# Patient Record
Sex: Female | Born: 1965 | Race: White | Hispanic: No | Marital: Married | Smoking: Former smoker
Health system: Southern US, Community
[De-identification: ages and names within clinical notes are randomized; demographics above are authoritative.]

## PROBLEM LIST (undated history)

## (undated) DIAGNOSIS — I272 Pulmonary hypertension, unspecified: Secondary | ICD-10-CM

## (undated) DIAGNOSIS — C801 Malignant (primary) neoplasm, unspecified: Secondary | ICD-10-CM

## (undated) DIAGNOSIS — J449 Chronic obstructive pulmonary disease, unspecified: Secondary | ICD-10-CM

## (undated) HISTORY — PX: CHOLECYSTECTOMY: SHX55

## (undated) HISTORY — PX: OTHER SURGICAL HISTORY: SHX169

---

## 2005-11-20 ENCOUNTER — Emergency Department: Payer: Self-pay | Admitting: Emergency Medicine

## 2006-03-25 ENCOUNTER — Emergency Department: Payer: Self-pay | Admitting: Unknown Physician Specialty

## 2008-10-12 ENCOUNTER — Emergency Department: Payer: Self-pay

## 2009-08-12 ENCOUNTER — Emergency Department: Payer: Self-pay | Admitting: Emergency Medicine

## 2010-08-26 ENCOUNTER — Ambulatory Visit: Payer: Self-pay | Admitting: Family Medicine

## 2011-04-24 ENCOUNTER — Emergency Department: Payer: Self-pay | Admitting: Emergency Medicine

## 2011-08-11 ENCOUNTER — Ambulatory Visit: Payer: Self-pay | Admitting: Family Medicine

## 2011-12-25 ENCOUNTER — Emergency Department: Payer: Self-pay | Admitting: Emergency Medicine

## 2012-03-21 ENCOUNTER — Observation Stay: Payer: Self-pay | Admitting: Internal Medicine

## 2012-03-21 LAB — BASIC METABOLIC PANEL
Anion Gap: 8 (ref 7–16)
BUN: 8 mg/dL (ref 7–18)
Calcium, Total: 8.8 mg/dL (ref 8.5–10.1)
Chloride: 109 mmol/L — ABNORMAL HIGH (ref 98–107)
EGFR (Non-African Amer.): >60
Glucose: 91 mg/dL (ref 65–99)
Osmolality: 281 (ref 275–301)
Sodium: 142 mmol/L (ref 136–145)

## 2012-03-21 LAB — CBC
Platelet: 210 10*3/uL (ref 150–440)
RBC: 4.22 10*6/uL (ref 3.80–5.20)
RDW: 12.9 % (ref 11.5–14.5)

## 2012-03-21 LAB — PREGNANCY, URINE: Pregnancy Test, Urine: NEGATIVE m[IU]/mL

## 2012-03-21 LAB — CK-MB: CK-MB: 0.6 ng/mL (ref 0.5–3.6)

## 2012-03-21 LAB — CK TOTAL AND CKMB (NOT AT ARMC)
CK, Total: 92 U/L (ref 21–215)
CK-MB: 0.6 ng/mL (ref 0.5–3.6)

## 2012-03-21 LAB — TROPONIN I: Troponin-I: <0.02 ng/mL

## 2012-03-21 LAB — CK: CK, Total: 72 U/L (ref 21–215)

## 2012-03-22 ENCOUNTER — Ambulatory Visit: Payer: Self-pay | Admitting: Oncology

## 2012-03-22 LAB — CBC WITH DIFFERENTIAL/PLATELET
Basophil #: 0.1 10*3/uL (ref 0.0–0.1)
Basophil %: 1.3 %
Eosinophil %: 2.5 %
HGB: 11.7 g/dL — ABNORMAL LOW (ref 12.0–16.0)
Lymphocyte #: 2.8 10*3/uL (ref 1.0–3.6)
MCH: 29.5 pg (ref 26.0–34.0)
MCHC: 32.4 g/dL (ref 32.0–36.0)
MCV: 91 fL (ref 80–100)
Monocyte #: 0.6 x10 3/mm (ref 0.2–0.9)
Neutrophil #: 2.9 10*3/uL (ref 1.4–6.5)
Neutrophil %: 44.3 %
RBC: 3.97 10*6/uL (ref 3.80–5.20)
RDW: 13.2 % (ref 11.5–14.5)
WBC: 6.5 10*3/uL (ref 3.6–11.0)

## 2012-03-22 LAB — BASIC METABOLIC PANEL
BUN: 9 mg/dL (ref 7–18)
Calcium, Total: 8.1 mg/dL — ABNORMAL LOW (ref 8.5–10.1)
Chloride: 111 mmol/L — ABNORMAL HIGH (ref 98–107)
Co2: 28 mmol/L (ref 21–32)
Creatinine: 0.99 mg/dL (ref 0.60–1.30)
EGFR (African American): >60
EGFR (Non-African Amer.): >60
Glucose: 86 mg/dL (ref 65–99)

## 2012-03-22 LAB — LIPID PANEL
Cholesterol: 158 mg/dL (ref 0–200)
HDL Cholesterol: 31 mg/dL — ABNORMAL LOW (ref 40–60)
Triglycerides: 127 mg/dL (ref 0–200)
VLDL Cholesterol, Calc: 25 mg/dL (ref 5–40)

## 2012-03-22 LAB — TROPONIN I: Troponin-I: <0.02 ng/mL

## 2012-03-28 ENCOUNTER — Ambulatory Visit: Payer: Self-pay | Admitting: Oncology

## 2012-03-29 ENCOUNTER — Ambulatory Visit: Payer: Self-pay | Admitting: Oncology

## 2012-04-08 ENCOUNTER — Ambulatory Visit: Payer: Self-pay | Admitting: Oncology

## 2012-04-13 ENCOUNTER — Emergency Department: Payer: Self-pay

## 2012-04-13 LAB — CBC WITH DIFFERENTIAL/PLATELET
Basophil %: 2.2 %
Eosinophil %: 0 %
HCT: 35.1 % (ref 35.0–47.0)
HGB: 11.8 g/dL — ABNORMAL LOW (ref 12.0–16.0)
Lymphocyte #: 1 10*3/uL (ref 1.0–3.6)
Lymphocyte %: 7.2 %
Neutrophil %: 83.8 %
Platelet: 180 10*3/uL (ref 150–440)
RDW: 13.3 % (ref 11.5–14.5)
WBC: 13.7 10*3/uL — ABNORMAL HIGH (ref 3.6–11.0)

## 2012-04-13 LAB — COMPREHENSIVE METABOLIC PANEL
Albumin: 3.4 g/dL (ref 3.4–5.0)
Anion Gap: 10 (ref 7–16)
Bilirubin,Total: 0.7 mg/dL (ref 0.2–1.0)
Calcium, Total: 8.2 mg/dL — ABNORMAL LOW (ref 8.5–10.1)
Co2: 23 mmol/L (ref 21–32)
EGFR (African American): >60
Glucose: 122 mg/dL — ABNORMAL HIGH (ref 65–99)
Osmolality: 280 (ref 275–301)
SGPT (ALT): 40 U/L
Sodium: 140 mmol/L (ref 136–145)

## 2012-04-27 ENCOUNTER — Ambulatory Visit: Payer: Self-pay | Admitting: Internal Medicine

## 2012-05-08 ENCOUNTER — Ambulatory Visit: Payer: Self-pay | Admitting: Oncology

## 2012-06-08 ENCOUNTER — Ambulatory Visit: Payer: Self-pay | Admitting: Oncology

## 2012-06-12 ENCOUNTER — Other Ambulatory Visit (HOSPITAL_COMMUNITY): Payer: Self-pay | Admitting: Cardiothoracic Surgery

## 2012-06-12 DIAGNOSIS — R911 Solitary pulmonary nodule: Secondary | ICD-10-CM

## 2012-06-15 ENCOUNTER — Encounter (HOSPITAL_COMMUNITY): Payer: Self-pay | Admitting: Pharmacy Technician

## 2012-06-15 ENCOUNTER — Other Ambulatory Visit: Payer: Self-pay | Admitting: Radiology

## 2012-06-19 ENCOUNTER — Ambulatory Visit (HOSPITAL_COMMUNITY)
Admission: RE | Admit: 2012-06-19 | Discharge: 2012-06-19 | Disposition: A | Discharge status: Home / Self Care | Payer: Medicaid Other | Source: Ambulatory Visit | Attending: Cardiothoracic Surgery | Admitting: Cardiothoracic Surgery

## 2012-06-19 ENCOUNTER — Encounter (HOSPITAL_COMMUNITY): Payer: Self-pay

## 2012-06-19 ENCOUNTER — Ambulatory Visit (HOSPITAL_COMMUNITY)
Admission: RE | Admit: 2012-06-19 | Discharge: 2012-06-19 | Disposition: A | Discharge status: Home / Self Care | Payer: Medicaid Other | Source: Ambulatory Visit | Attending: Interventional Radiology | Admitting: Interventional Radiology

## 2012-06-19 DIAGNOSIS — J449 Chronic obstructive pulmonary disease, unspecified: Secondary | ICD-10-CM | POA: Insufficient documentation

## 2012-06-19 DIAGNOSIS — Z87891 Personal history of nicotine dependence: Secondary | ICD-10-CM | POA: Insufficient documentation

## 2012-06-19 DIAGNOSIS — Y849 Medical procedure, unspecified as the cause of abnormal reaction of the patient, or of later complication, without mention of misadventure at the time of the procedure: Secondary | ICD-10-CM | POA: Insufficient documentation

## 2012-06-19 DIAGNOSIS — R911 Solitary pulmonary nodule: Secondary | ICD-10-CM

## 2012-06-19 DIAGNOSIS — Z09 Encounter for follow-up examination after completed treatment for conditions other than malignant neoplasm: Secondary | ICD-10-CM | POA: Insufficient documentation

## 2012-06-19 DIAGNOSIS — D3A090 Benign carcinoid tumor of the bronchus and lung: Secondary | ICD-10-CM | POA: Insufficient documentation

## 2012-06-19 DIAGNOSIS — IMO0002 Reserved for concepts with insufficient information to code with codable children: Secondary | ICD-10-CM | POA: Insufficient documentation

## 2012-06-19 DIAGNOSIS — J4489 Other specified chronic obstructive pulmonary disease: Secondary | ICD-10-CM | POA: Insufficient documentation

## 2012-06-19 DIAGNOSIS — I2789 Other specified pulmonary heart diseases: Secondary | ICD-10-CM | POA: Insufficient documentation

## 2012-06-19 HISTORY — DX: Pulmonary hypertension, unspecified: I27.20

## 2012-06-19 HISTORY — DX: Chronic obstructive pulmonary disease, unspecified: J44.9

## 2012-06-19 LAB — CBC
MCH: 29.3 pg (ref 26.0–34.0)
Platelets: 212 10*3/uL (ref 150–400)
RBC: 4.23 MIL/uL (ref 3.87–5.11)
RDW: 13.1 % (ref 11.5–15.5)
WBC: 6.2 10*3/uL (ref 4.0–10.5)

## 2012-06-19 LAB — PROTIME-INR: Prothrombin Time: 13.1 seconds (ref 11.6–15.2)

## 2012-06-19 MED ORDER — SODIUM CHLORIDE 0.9 % IV SOLN
INTRAVENOUS | Status: DC
  Administered 2012-06-19: 09:00:00 via INTRAVENOUS

## 2012-06-19 MED ORDER — DIPHENHYDRAMINE HCL 50 MG/ML IJ SOLN
25.0000 mg | Freq: Once | INTRAMUSCULAR | Status: AC
  Administered 2012-06-19: 25 mg via INTRAVENOUS
  Filled 2012-06-19: qty 0.5

## 2012-06-19 MED ORDER — HYDROMORPHONE HCL PF 2 MG/ML IJ SOLN: 1.0000 mg | INTRAMUSCULAR | Status: DC | PRN

## 2012-06-19 MED ORDER — DIPHENHYDRAMINE HCL 50 MG/ML IJ SOLN
INTRAMUSCULAR | Status: AC
  Administered 2012-06-19: 25 mg via INTRAVENOUS
  Filled 2012-06-19: qty 1

## 2012-06-19 MED ORDER — HYDROMORPHONE HCL PF 1 MG/ML IJ SOLN
INTRAMUSCULAR | Status: AC
  Administered 2012-06-19: 1 mg via INTRAVENOUS
  Filled 2012-06-19: qty 1

## 2012-06-19 MED ORDER — MIDAZOLAM HCL 5 MG/5ML IJ SOLN
INTRAMUSCULAR | Status: AC | PRN
  Administered 2012-06-19 (×2): 2 mg via INTRAVENOUS

## 2012-06-19 MED ORDER — DIPHENHYDRAMINE HCL 25 MG PO CAPS: 25.0000 mg | ORAL_CAPSULE | ORAL | Status: DC

## 2012-06-19 MED ORDER — DIPHENHYDRAMINE HCL 25 MG PO TABS
25.0000 mg | ORAL_TABLET | ORAL | Status: DC
  Administered 2012-06-19: 25 mg via ORAL
  Filled 2012-06-19: qty 1

## 2012-06-19 MED ORDER — DIPHENHYDRAMINE HCL 25 MG PO CAPS
25.0000 mg | ORAL_CAPSULE | ORAL | Status: DC
  Filled 2012-06-19 (×2): qty 1

## 2012-06-19 MED ORDER — FENTANYL CITRATE 0.05 MG/ML IJ SOLN
INTRAMUSCULAR | Status: AC | PRN
  Administered 2012-06-19: 100 ug via INTRAVENOUS

## 2012-06-19 MED ORDER — HYDROCODONE-ACETAMINOPHEN 5-325 MG PO TABS
1.0000 | ORAL_TABLET | ORAL | Status: DC | PRN
  Administered 2012-06-19: 1 via ORAL
  Filled 2012-06-19: qty 2
  Filled 2012-06-19: qty 1

## 2012-06-19 NOTE — Progress Notes (Signed)
Ambulated to BR with minimal assist. Tolerated this well. Pain is O. Still c/o of mild itching. Placed call to Dr Fredia Sorrow and orders received and pt is OK for discharge

## 2012-06-19 NOTE — H&P (Signed)
Agree 

## 2012-06-19 NOTE — Progress Notes (Addendum)
Pt returns form Radiology post lung bx warm and dry color pink . States pain right anterior ches is 7/10 then falls asleep. VSS Band-Aid right post thorasic  area has small amt blood on it. Pt has small amount of blood from nares on tissue

## 2012-06-19 NOTE — Procedures (Signed)
Procedure:  CT guided core biopsy of RLL lung nodule Findings:  9 mm RLL nodule.  CT guided 18 G core bx x 2 via 17 G needle.  Regional hemorrhage w/ hemoptysis.  No PTX.

## 2012-06-19 NOTE — Progress Notes (Signed)
Pt awaken from sound sleep states she is itching all over .no visible rash. Pt states pain is 8/10 then back to sleep Placed call to Dr Fredia Sorrow orders recieved

## 2012-06-19 NOTE — H&P (Signed)
Heather Zamora is an 46 y.o. female.   Chief Complaint: right lung mass HPI: Patient with history of COPD/ prior long term tobacco use and recent imaging studies revealing a right lower lobe lung nodule. She presents today for CT guided biopsy of the right lung nodule.  Past Medical History  Diagnosis Date  . COPD (chronic obstructive pulmonary disease)   . Pulmonary hypertension     doctor gave okay for procedure today    Past Surgical History  Procedure Date  . Cholecystectomy   . Cystectomy ovary   . Ovarian cyst removed    FH: 6 brothers, 1 deceased; positive for CAD Social History: married, lives in [place], 4 children, prior smoker, no alcohol use Allergies:  Allergies  Allergen Reactions  . Penicillins Hives    Current outpatient prescriptions:ibuprofen (ADVIL,MOTRIN) 200 MG tablet, Take 1,000 mg by mouth every 6 (six) hours as needed. pain, Disp: , Rfl:  Current facility-administered medications:0.9 %  sodium chloride infusion, , Intravenous, Continuous, Brayton El, PA   Results for orders placed during the hospital encounter of 06/19/12 (from the past 48 hour(s))  CBC     Status: Normal   Collection Time   06/19/12  8:49 AM      Component Value Range Comment   WBC 6.2  4.0 - 10.5 K/uL    RBC 4.23  3.87 - 5.11 MIL/uL    Hemoglobin 12.4  12.0 - 15.0 g/dL    HCT 29.5  28.4 - 13.2 %    MCV 87.9  78.0 - 100.0 fL    MCH 29.3  26.0 - 34.0 pg    MCHC 33.3  30.0 - 36.0 g/dL    RDW 44.0  10.2 - 72.5 %    Platelets 212  150 - 400 K/uL    Results for orders placed during the hospital encounter of 06/19/12  APTT      Component Value Range   aPTT 32  24 - 37 seconds  CBC      Component Value Range   WBC 6.2  4.0 - 10.5 K/uL   RBC 4.23  3.87 - 5.11 MIL/uL   Hemoglobin 12.4  12.0 - 15.0 g/dL   HCT 36.6  44.0 - 34.7 %   MCV 87.9  78.0 - 100.0 fL   MCH 29.3  26.0 - 34.0 pg   MCHC 33.3  30.0 - 36.0 g/dL   RDW 42.5  95.6 - 38.7 %   Platelets 212  150 - 400 K/uL    PROTIME-INR      Component Value Range   Prothrombin Time 13.1  11.6 - 15.2 seconds   INR 0.97  0.00 - 1.49    Review of Systems  Constitutional: Negative for fever and chills.  Respiratory: Positive for shortness of breath. Negative for cough.   Cardiovascular: Positive for chest pain.  Gastrointestinal: Negative for nausea, vomiting and abdominal pain.  Musculoskeletal: Positive for back pain.  Neurological: Negative for headaches.  Endo/Heme/Allergies: Does not bruise/bleed easily.    Blood pressure 116/79, pulse 59, temperature 97.1 F (36.2 C), temperature source Oral, resp. rate 16, height 5\' 1"  (1.549 m), weight 165 lb (74.844 kg), SpO2 100.00%. Physical Exam  Constitutional: She is oriented to person, place, and time. She appears well-developed and well-nourished.  Cardiovascular: Regular rhythm.        bradycardic  Respiratory: Effort normal.       Distant BS bilat  GI: Soft. Bowel sounds are normal. There is no tenderness.  Musculoskeletal: Normal range of motion. She exhibits no edema.  Neurological: She is alert and oriented to person, place, and time.     Assessment/Plan Patient with COPD/ prior history of smoking and RLL lung nodule; plan is for CT guided biopsy of the right lung nodule. Details/risks of the procedure d/w pt/husband with their understanding and consent.  Ceazia Harb,D KEVIN 06/19/2012, 9:10 AM

## 2012-06-29 ENCOUNTER — Ambulatory Visit: Payer: Self-pay | Admitting: Cardiothoracic Surgery

## 2012-06-29 LAB — COMPREHENSIVE METABOLIC PANEL
Albumin: 4 g/dL (ref 3.4–5.0)
Anion Gap: 7 (ref 7–16)
BUN: 10 mg/dL (ref 7–18)
Bilirubin,Total: 0.7 mg/dL (ref 0.2–1.0)
Chloride: 102 mmol/L (ref 98–107)
Creatinine: 1.08 mg/dL (ref 0.60–1.30)
EGFR (African American): >60
EGFR (Non-African Amer.): >60
Glucose: 92 mg/dL (ref 65–99)
Potassium: 4.2 mmol/L (ref 3.5–5.1)
SGOT(AST): 18 U/L (ref 15–37)
Sodium: 139 mmol/L (ref 136–145)
Total Protein: 7.9 g/dL (ref 6.4–8.2)

## 2012-06-29 LAB — CBC CANCER CENTER
Basophil #: 0.1 x10 3/mm (ref 0.0–0.1)
HCT: 38.7 % (ref 35.0–47.0)
Lymphocyte #: 2.6 x10 3/mm (ref 1.0–3.6)
MCH: 29.1 pg (ref 26.0–34.0)
MCV: 89 fL (ref 80–100)
Monocyte #: 0.5 x10 3/mm (ref 0.2–0.9)
Neutrophil #: 3.8 x10 3/mm (ref 1.4–6.5)
Platelet: 249 x10 3/mm (ref 150–440)
RBC: 4.37 10*6/uL (ref 3.80–5.20)
RDW: 13.5 % (ref 11.5–14.5)
WBC: 7.1 x10 3/mm (ref 3.6–11.0)

## 2012-06-29 LAB — APTT: Activated PTT: 30.3 secs (ref 23.6–35.9)

## 2012-07-09 ENCOUNTER — Ambulatory Visit: Payer: Self-pay | Admitting: Oncology

## 2012-07-11 ENCOUNTER — Ambulatory Visit: Payer: Self-pay

## 2012-07-27 LAB — CBC CANCER CENTER
Basophil #: 0.1 x10 3/mm (ref 0.0–0.1)
Eosinophil #: 0.1 x10 3/mm (ref 0.0–0.7)
Lymphocyte #: 2.5 x10 3/mm (ref 1.0–3.6)
Lymphocyte %: 36.8 %
MCHC: 32.8 g/dL (ref 32.0–36.0)
Monocyte %: 7.7 %
Platelet: 229 x10 3/mm (ref 150–440)
RBC: 4.11 10*6/uL (ref 3.80–5.20)
RDW: 13 % (ref 11.5–14.5)

## 2012-07-27 LAB — COMPREHENSIVE METABOLIC PANEL
Albumin: 3.9 g/dL (ref 3.4–5.0)
Anion Gap: 5 — ABNORMAL LOW (ref 7–16)
BUN: 8 mg/dL (ref 7–18)
Bilirubin,Total: 0.5 mg/dL (ref 0.2–1.0)
Chloride: 103 mmol/L (ref 98–107)
Creatinine: 1.15 mg/dL (ref 0.60–1.30)
EGFR (African American): >60
Potassium: 4.3 mmol/L (ref 3.5–5.1)
Total Protein: 7.7 g/dL (ref 6.4–8.2)

## 2012-08-02 ENCOUNTER — Inpatient Hospital Stay: Payer: Self-pay

## 2012-08-03 LAB — CBC WITH DIFFERENTIAL/PLATELET
Basophil #: 0.1 10*3/uL (ref 0.0–0.1)
Basophil %: 0.9 %
Eosinophil #: 0 10*3/uL (ref 0.0–0.7)
HCT: 32.5 % — ABNORMAL LOW (ref 35.0–47.0)
HGB: 11.4 g/dL — ABNORMAL LOW (ref 12.0–16.0)
Lymphocyte %: 19.5 %
MCHC: 34.9 g/dL (ref 32.0–36.0)
Monocyte #: 0.8 x10 3/mm (ref 0.2–0.9)
Monocyte %: 9.6 %
Neutrophil %: 69.8 %
RDW: 13 % (ref 11.5–14.5)
WBC: 8.7 10*3/uL (ref 3.6–11.0)

## 2012-08-03 LAB — BASIC METABOLIC PANEL
Co2: 27 mmol/L (ref 21–32)
Creatinine: 0.91 mg/dL (ref 0.60–1.30)
EGFR (Non-African Amer.): >60
Osmolality: 274 (ref 275–301)
Sodium: 137 mmol/L (ref 136–145)

## 2012-08-05 LAB — BASIC METABOLIC PANEL
Anion Gap: 7 (ref 7–16)
BUN: 6 mg/dL — ABNORMAL LOW (ref 7–18)
Calcium, Total: 8.7 mg/dL (ref 8.5–10.1)
Creatinine: 0.69 mg/dL (ref 0.60–1.30)
EGFR (African American): >60
EGFR (Non-African Amer.): >60
Glucose: 109 mg/dL — ABNORMAL HIGH (ref 65–99)
Osmolality: 281 (ref 275–301)

## 2012-08-05 LAB — CBC WITH DIFFERENTIAL/PLATELET
Basophil #: 0.1 10*3/uL (ref 0.0–0.1)
Eosinophil %: 1.4 %
HCT: 33 % — ABNORMAL LOW (ref 35.0–47.0)
HGB: 11 g/dL — ABNORMAL LOW (ref 12.0–16.0)
Lymphocyte #: 1.6 10*3/uL (ref 1.0–3.6)
MCH: 29.8 pg (ref 26.0–34.0)
MCHC: 33.4 g/dL (ref 32.0–36.0)
MCV: 89 fL (ref 80–100)
Monocyte #: 0.5 x10 3/mm (ref 0.2–0.9)
RBC: 3.7 10*6/uL — ABNORMAL LOW (ref 3.80–5.20)

## 2012-08-08 ENCOUNTER — Ambulatory Visit: Payer: Self-pay | Admitting: Oncology

## 2012-08-10 ENCOUNTER — Ambulatory Visit: Payer: Self-pay | Admitting: Cardiothoracic Surgery

## 2012-08-10 LAB — CBC CANCER CENTER
Basophil #: 0.1 x10 3/mm (ref 0.0–0.1)
Eosinophil #: 0.1 x10 3/mm (ref 0.0–0.7)
Eosinophil %: 1.6 %
Lymphocyte %: 18.5 %
MCH: 29.7 pg (ref 26.0–34.0)
MCHC: 33 g/dL (ref 32.0–36.0)
MCV: 90 fL (ref 80–100)
Monocyte #: 0.6 x10 3/mm (ref 0.2–0.9)
Monocyte %: 7.3 %
Neutrophil #: 6.3 x10 3/mm (ref 1.4–6.5)
Platelet: 309 x10 3/mm (ref 150–440)
RBC: 3.85 10*6/uL (ref 3.80–5.20)
RDW: 12.9 % (ref 11.5–14.5)
WBC: 8.8 x10 3/mm (ref 3.6–11.0)

## 2012-08-14 ENCOUNTER — Ambulatory Visit: Payer: Self-pay | Admitting: Cardiothoracic Surgery

## 2012-08-29 ENCOUNTER — Emergency Department: Payer: Self-pay | Admitting: Emergency Medicine

## 2012-08-29 LAB — CBC
HCT: 34.3 % — ABNORMAL LOW
HGB: 11.7 g/dL — ABNORMAL LOW
MCH: 29.9 pg
MCHC: 34.2 g/dL
MCV: 88 fL
Platelet: 230 10*3/uL
RBC: 3.92 X10 6/mm 3
RDW: 12.9 %
WBC: 7 10*3/uL

## 2012-08-29 LAB — PROTIME-INR
INR: 1
Prothrombin Time: 13.4 s

## 2012-08-29 LAB — COMPREHENSIVE METABOLIC PANEL
Alkaline Phosphatase: 138 U/L — ABNORMAL HIGH (ref 50–136)
Anion Gap: 8 (ref 7–16)
BUN: 7 mg/dL (ref 7–18)
Bilirubin,Total: 0.3 mg/dL (ref 0.2–1.0)
Calcium, Total: 8.7 mg/dL (ref 8.5–10.1)
Chloride: 106 mmol/L (ref 98–107)
EGFR (African American): >60
EGFR (Non-African Amer.): >60
Potassium: 3.7 mmol/L (ref 3.5–5.1)
SGOT(AST): 31 U/L (ref 15–37)
SGPT (ALT): 40 U/L (ref 12–78)
Sodium: 141 mmol/L (ref 136–145)

## 2012-08-29 LAB — CK TOTAL AND CKMB (NOT AT ARMC)
CK, Total: 79 U/L
CK-MB: 0.6 ng/mL

## 2012-08-29 LAB — APTT: Activated PTT: 30.9 s

## 2012-08-29 LAB — TROPONIN I: Troponin-I: <0.02 ng/mL

## 2012-09-08 ENCOUNTER — Ambulatory Visit: Payer: Self-pay | Admitting: Oncology

## 2012-10-08 ENCOUNTER — Ambulatory Visit: Payer: Self-pay | Admitting: Oncology

## 2012-11-08 ENCOUNTER — Ambulatory Visit: Payer: Self-pay | Admitting: Oncology

## 2012-12-05 ENCOUNTER — Ambulatory Visit: Payer: Self-pay | Admitting: Nurse Practitioner

## 2012-12-12 ENCOUNTER — Ambulatory Visit: Payer: Self-pay | Admitting: Oncology

## 2013-01-06 ENCOUNTER — Ambulatory Visit: Payer: Self-pay | Admitting: Oncology

## 2013-01-16 LAB — CBC CANCER CENTER
Basophil #: 0.1 x10 3/mm (ref 0.0–0.1)
Eosinophil #: 0.1 x10 3/mm (ref 0.0–0.7)
HCT: 37.5 % (ref 35.0–47.0)
HGB: 12.5 g/dL (ref 12.0–16.0)
Lymphocyte %: 29.9 %
MCH: 28.8 pg (ref 26.0–34.0)
MCHC: 33.4 g/dL (ref 32.0–36.0)
MCV: 86 fL (ref 80–100)
Monocyte #: 0.6 x10 3/mm (ref 0.2–0.9)
Neutrophil #: 4 x10 3/mm (ref 1.4–6.5)
Neutrophil %: 58.5 %
RDW: 13.6 % (ref 11.5–14.5)
WBC: 6.8 x10 3/mm (ref 3.6–11.0)

## 2013-01-16 LAB — COMPREHENSIVE METABOLIC PANEL
Alkaline Phosphatase: 114 U/L (ref 50–136)
Anion Gap: 6 — ABNORMAL LOW (ref 7–16)
Bilirubin,Total: 0.6 mg/dL (ref 0.2–1.0)
Calcium, Total: 9.4 mg/dL (ref 8.5–10.1)
Co2: 30 mmol/L (ref 21–32)
Creatinine: 1.07 mg/dL (ref 0.60–1.30)
EGFR (African American): >60
EGFR (Non-African Amer.): >60
Glucose: 78 mg/dL (ref 65–99)
Osmolality: 279 (ref 275–301)
SGOT(AST): 23 U/L (ref 15–37)
SGPT (ALT): 38 U/L (ref 12–78)
Sodium: 140 mmol/L (ref 136–145)

## 2013-02-06 ENCOUNTER — Ambulatory Visit: Payer: Self-pay | Admitting: Oncology

## 2013-03-21 ENCOUNTER — Emergency Department: Payer: Self-pay | Admitting: Emergency Medicine

## 2013-03-21 LAB — COMPREHENSIVE METABOLIC PANEL
Alkaline Phosphatase: 94 U/L (ref 50–136)
BUN: 10 mg/dL (ref 7–18)
Chloride: 108 mmol/L — ABNORMAL HIGH (ref 98–107)
Creatinine: 0.99 mg/dL (ref 0.60–1.30)
EGFR (African American): >60
EGFR (Non-African Amer.): >60
Osmolality: 276 (ref 275–301)
Potassium: 4.1 mmol/L (ref 3.5–5.1)
SGPT (ALT): 16 U/L (ref 12–78)
Total Protein: 7.1 g/dL (ref 6.4–8.2)

## 2013-03-21 LAB — CK TOTAL AND CKMB (NOT AT ARMC): CK, Total: 175 U/L (ref 21–215)

## 2013-03-21 LAB — TROPONIN I: Troponin-I: <0.02 ng/mL

## 2013-03-21 LAB — CBC
MCHC: 34.5 g/dL (ref 32.0–36.0)
Platelet: 220 10*3/uL (ref 150–440)
RDW: 13.3 % (ref 11.5–14.5)
WBC: 5.6 10*3/uL (ref 3.6–11.0)

## 2013-03-21 LAB — PRO B NATRIURETIC PEPTIDE: B-Type Natriuretic Peptide: 29 pg/mL (ref 0–125)

## 2013-04-07 LAB — CBC
HCT: 34.7 % — ABNORMAL LOW (ref 35.0–47.0)
HGB: 11.6 g/dL — ABNORMAL LOW (ref 12.0–16.0)
MCHC: 33.5 g/dL (ref 32.0–36.0)
Platelet: 218 10*3/uL (ref 150–440)
RBC: 4.06 10*6/uL (ref 3.80–5.20)
RDW: 13.1 % (ref 11.5–14.5)

## 2013-04-07 LAB — BASIC METABOLIC PANEL
Anion Gap: 6 — ABNORMAL LOW (ref 7–16)
Calcium, Total: 8.8 mg/dL (ref 8.5–10.1)
Chloride: 107 mmol/L (ref 98–107)
EGFR (African American): >60
EGFR (Non-African Amer.): >60
Glucose: 87 mg/dL (ref 65–99)

## 2013-04-08 ENCOUNTER — Observation Stay: Payer: Self-pay | Admitting: Internal Medicine

## 2013-04-08 LAB — CK: CK, Total: 86 U/L (ref 21–215)

## 2013-04-08 LAB — TROPONIN I: Troponin-I: <0.02 ng/mL

## 2013-04-15 ENCOUNTER — Emergency Department: Payer: Self-pay | Admitting: Emergency Medicine

## 2013-05-08 ENCOUNTER — Ambulatory Visit: Payer: Self-pay | Admitting: Oncology

## 2013-05-10 ENCOUNTER — Ambulatory Visit: Payer: Self-pay | Admitting: Family Medicine

## 2013-06-06 ENCOUNTER — Ambulatory Visit: Payer: Self-pay | Admitting: Family Medicine

## 2013-06-08 ENCOUNTER — Ambulatory Visit: Payer: Self-pay | Admitting: Oncology

## 2013-07-09 ENCOUNTER — Ambulatory Visit: Payer: Self-pay | Admitting: Oncology

## 2013-07-19 ENCOUNTER — Ambulatory Visit: Payer: Self-pay | Admitting: Oncology

## 2013-07-23 LAB — COMPREHENSIVE METABOLIC PANEL
Albumin: 3.7 g/dL (ref 3.4–5.0)
Anion Gap: 7 (ref 7–16)
BUN: 9 mg/dL (ref 7–18)
Bilirubin,Total: 0.3 mg/dL (ref 0.2–1.0)
Calcium, Total: 9.6 mg/dL (ref 8.5–10.1)
Chloride: 105 mmol/L (ref 98–107)
EGFR (Non-African Amer.): >60
Glucose: 94 mg/dL (ref 65–99)
Potassium: 4.7 mmol/L (ref 3.5–5.1)
SGPT (ALT): 45 U/L (ref 12–78)
Sodium: 142 mmol/L (ref 136–145)
Total Protein: 7.2 g/dL (ref 6.4–8.2)

## 2013-07-23 LAB — CBC CANCER CENTER
Basophil %: 1.3 %
Eosinophil #: 0.1 x10 3/mm (ref 0.0–0.7)
Eosinophil %: 1.5 %
HCT: 37.8 % (ref 35.0–47.0)
HGB: 12.7 g/dL (ref 12.0–16.0)
Lymphocyte #: 1.8 x10 3/mm (ref 1.0–3.6)
Lymphocyte %: 33.5 %
MCH: 29 pg (ref 26.0–34.0)
MCHC: 33.7 g/dL (ref 32.0–36.0)
MCV: 86 fL (ref 80–100)
Monocyte #: 0.5 x10 3/mm (ref 0.2–0.9)
Neutrophil #: 3 x10 3/mm (ref 1.4–6.5)
RBC: 4.4 10*6/uL (ref 3.80–5.20)
RDW: 13.2 % (ref 11.5–14.5)

## 2013-07-28 ENCOUNTER — Emergency Department: Payer: Self-pay | Admitting: Emergency Medicine

## 2013-08-08 ENCOUNTER — Ambulatory Visit: Payer: Self-pay | Admitting: Oncology

## 2013-08-12 IMAGING — CR DG CHEST 1V PORT
1 series · 1 of 1 positions shown · non-contrast
Comparison: none

REASON FOR EXAM: chest pain
COMMENTS:

PROCEDURE:     DXR - DXR PORTABLE CHEST SINGLE VIEW  - March 21, 2012  [DATE]
RESULT:     The lung fields are clear. The heart, mediastinal and osseous
structures show no significant abnormalities. Monitoring electrodes are
present.

[portable]
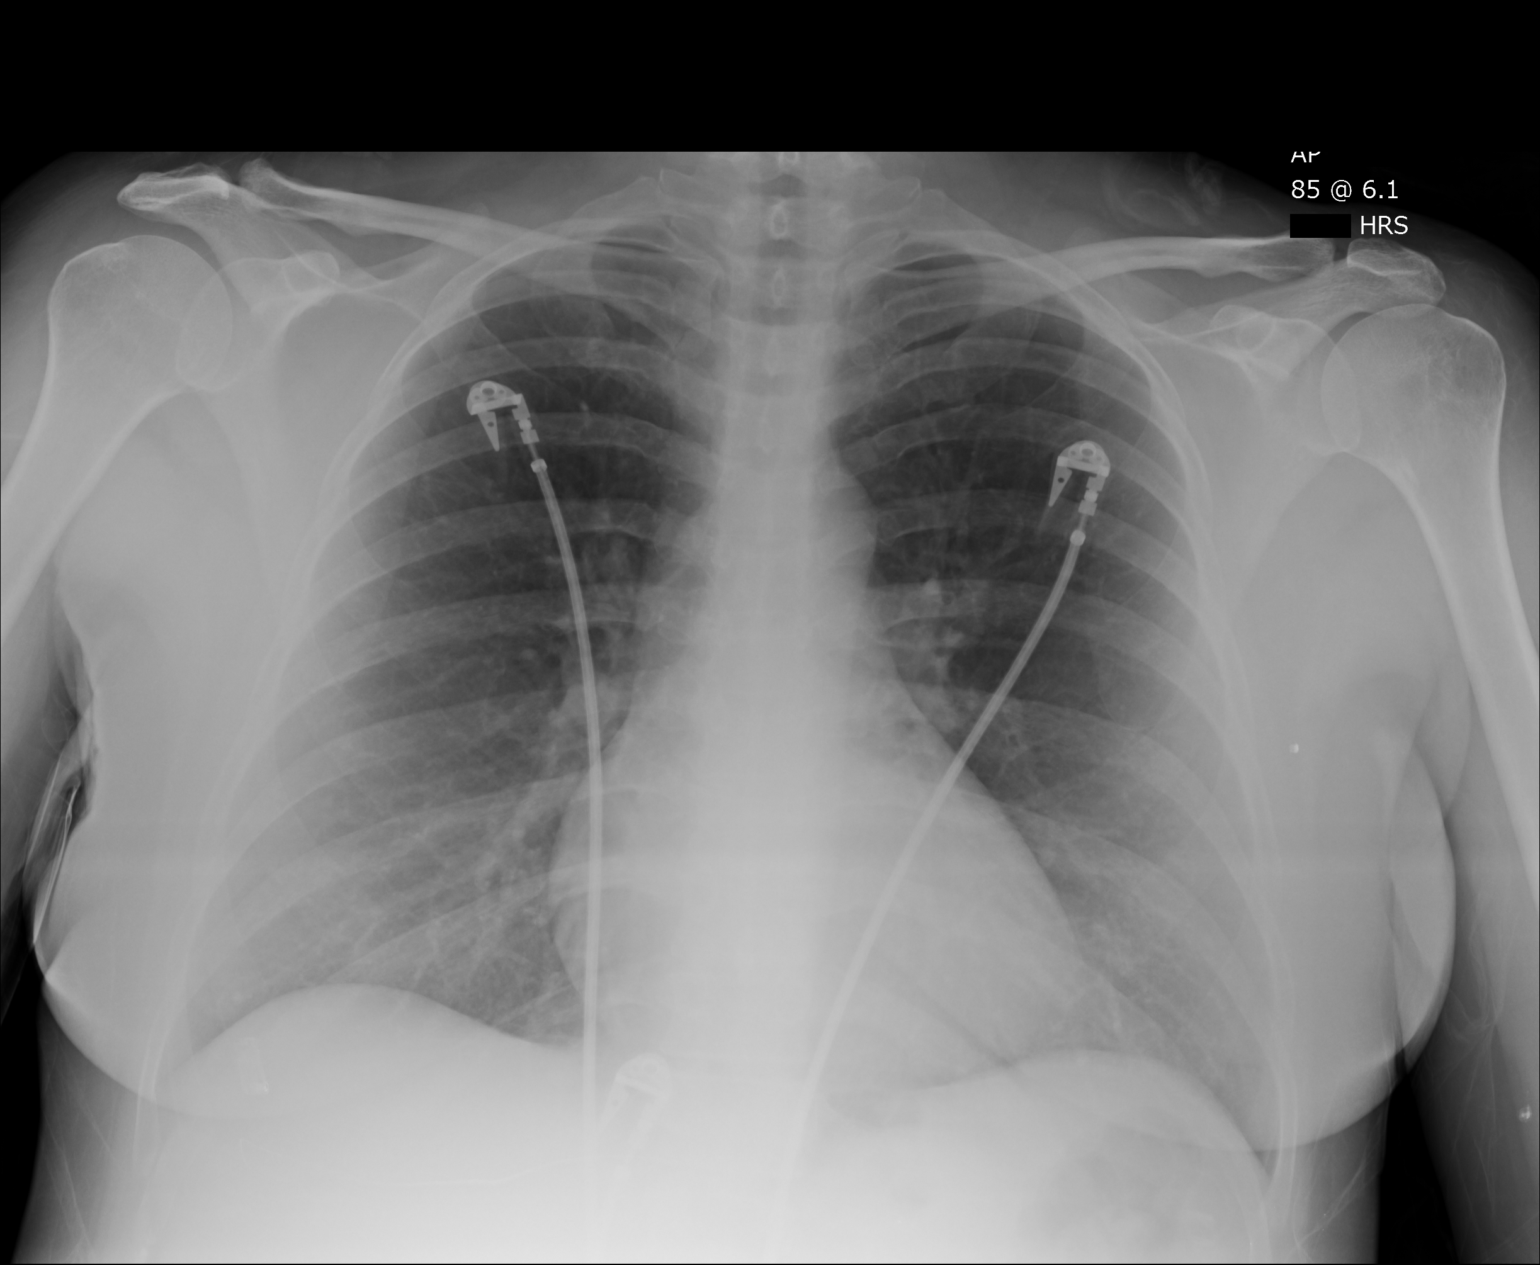

[1 of 1 positions shown; findings below may reference images not displayed]

IMPRESSION: No acute changes are identified.

## 2013-08-12 IMAGING — CT CT CHEST W/ CM
1 of 2 series · 14 of 32 positions shown, 18 images · IV contrast (APPLIED)
Comparison: none

REASON FOR EXAM: chest pain
COMMENTS:   LMP: Negative Beta HCG

PROCEDURE:     CT  - CT CHEST WITH CONTRAST  - March 21, 2012  [DATE]
RESULT:     Comparison: Chest CT 08/11/2011
TECHNIQUE: Multiple thin section axial images were obtained from the lung
apices to the upper abdomen following 100 ml Isovue 370 intravenous
contrast, according to the PE protocol. These images were also reviewed on a
[employer] multiplanar work station.

[Series 5: lung windows · axial · 0.66mm/px · z∈[-552,-306]mm · 14 of 98 slices shown, 18 images]
[im 8/98  mediastinal]
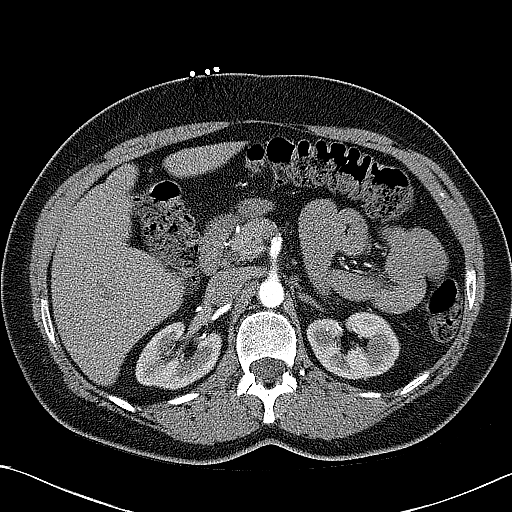
[im 8/98  lung]
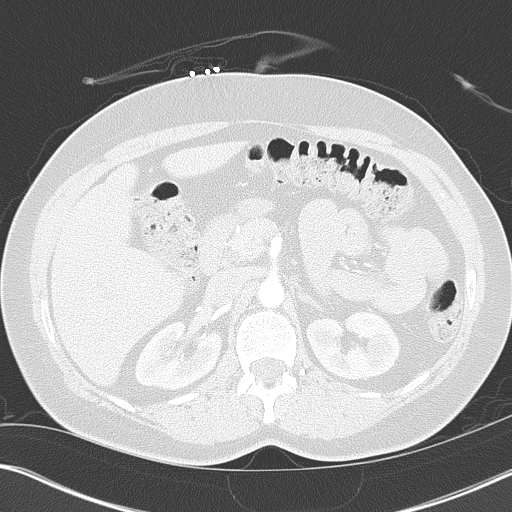
[im 15/98  lung]
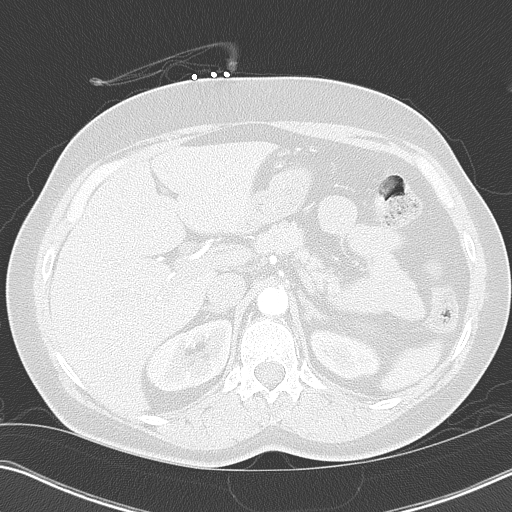
[im 23/98  lung]
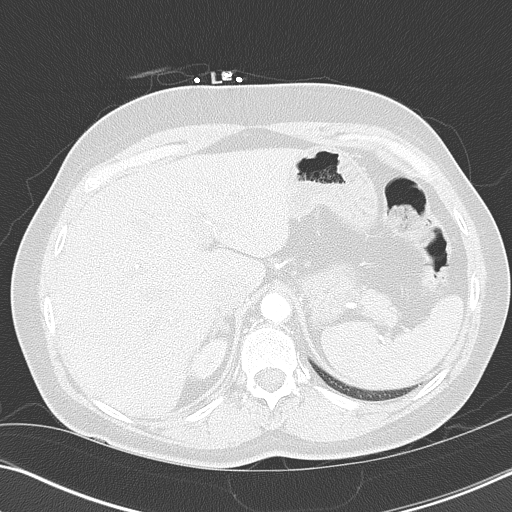
[im 30/98  lung]
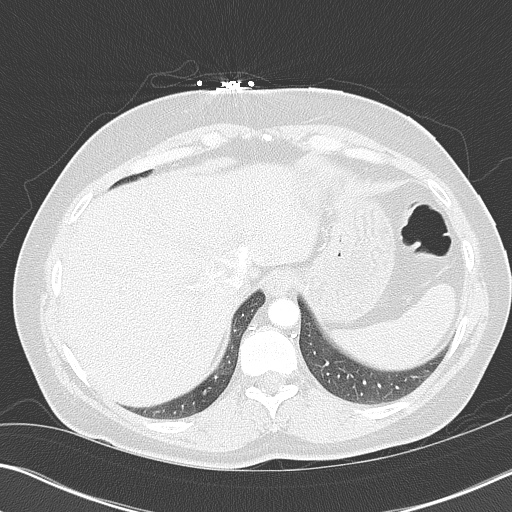
[im 38/98  mediastinal]
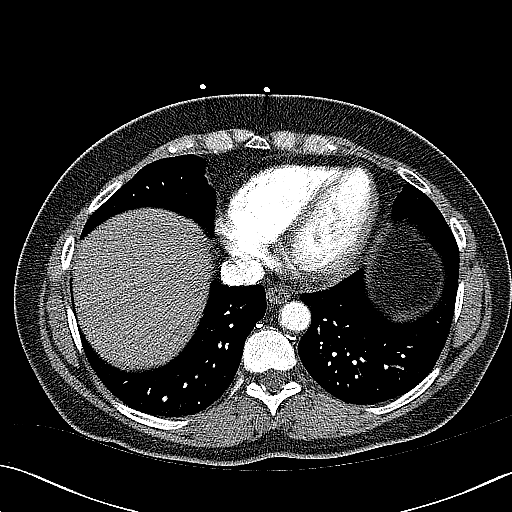
[im 38/98  lung]
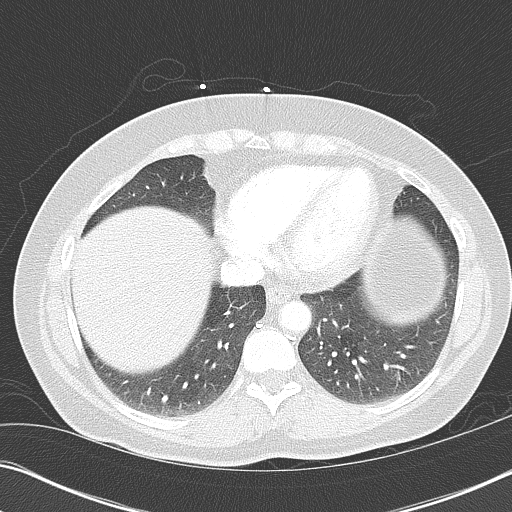
[im 45/98  lung]
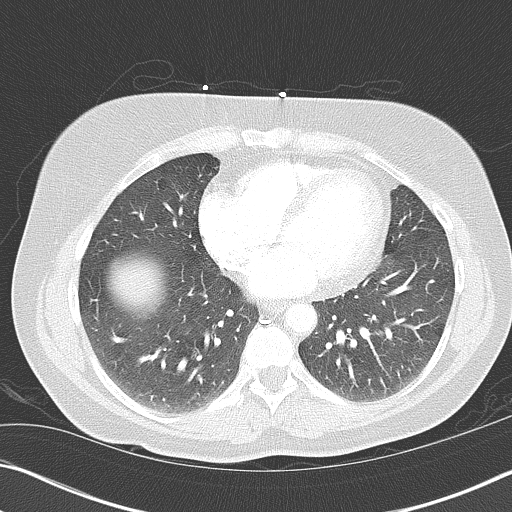
[im 46/98  lung]
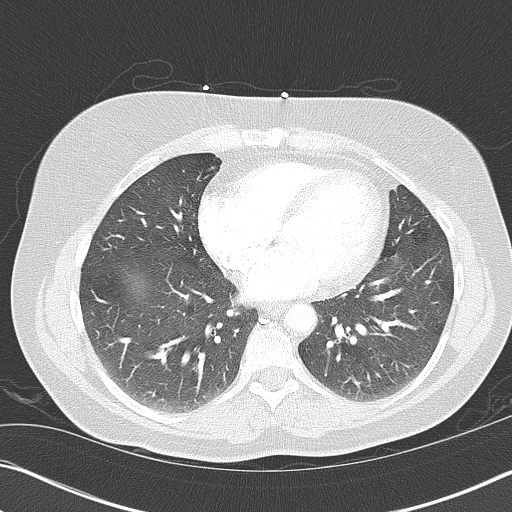
[im 49/98  lung]
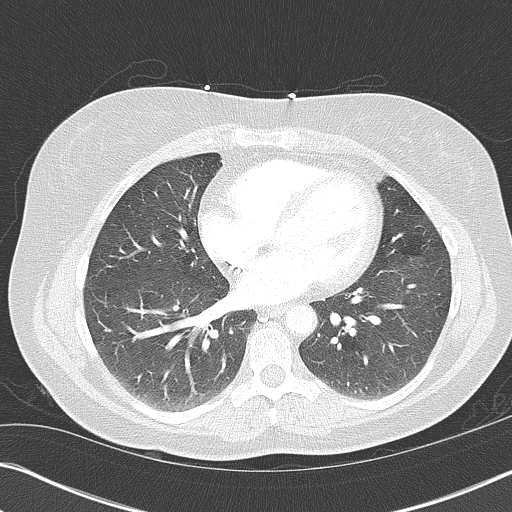
[im 53/98  mediastinal]
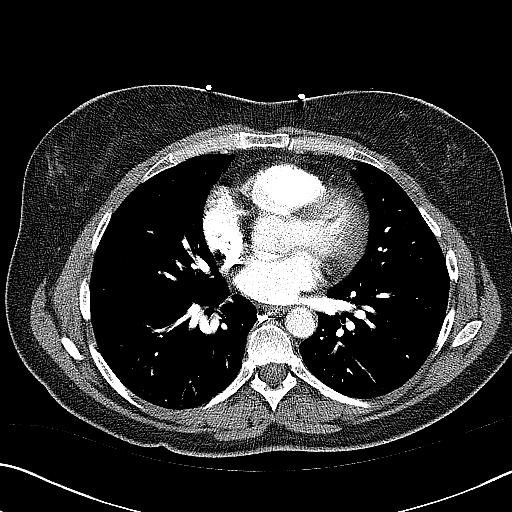
[im 53/98  lung]
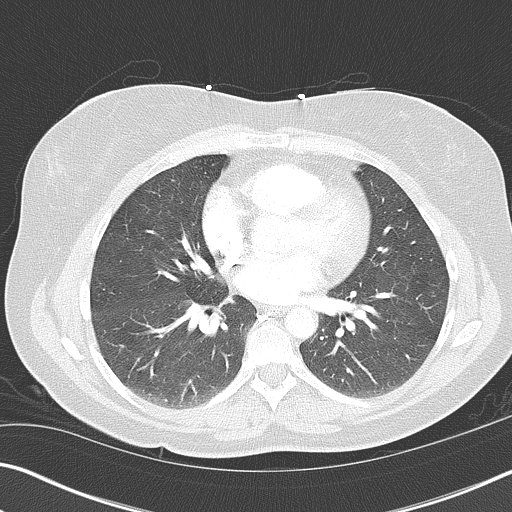
[im 60/98  lung]
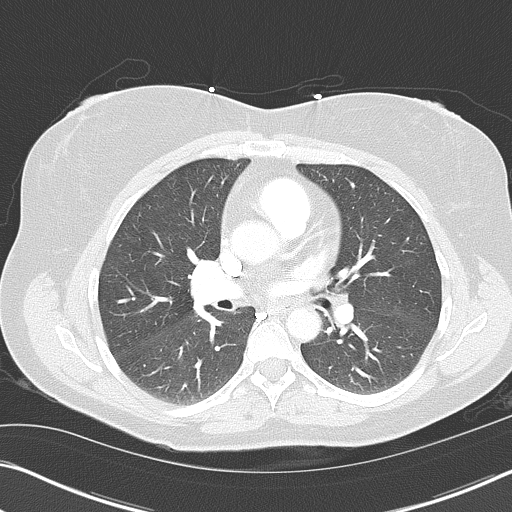
[im 68/98  lung]
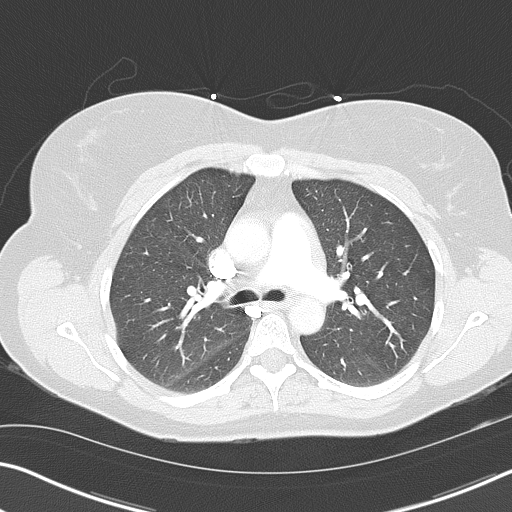
[im 75/98  lung]
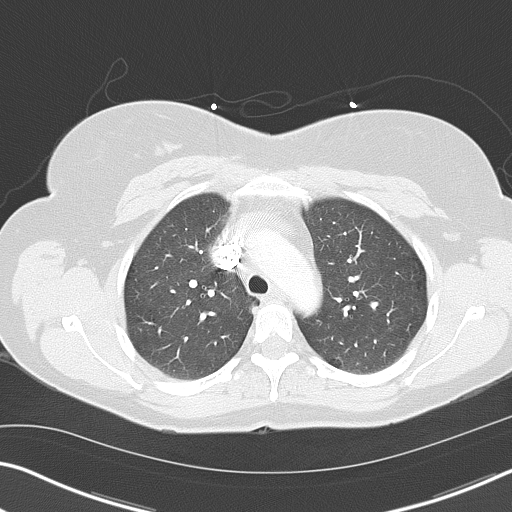
[im 83/98  mediastinal]
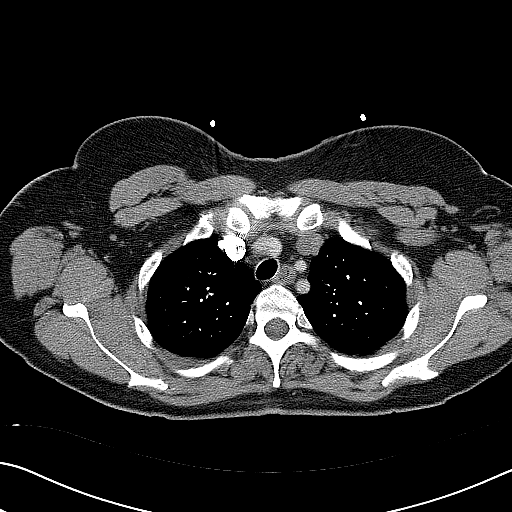
[im 83/98  lung]
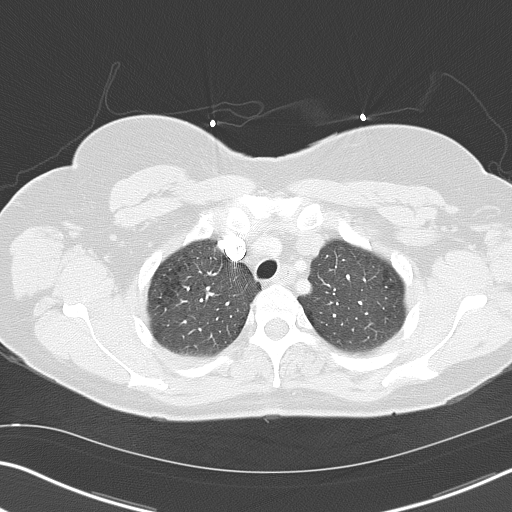
[im 90/98  lung]
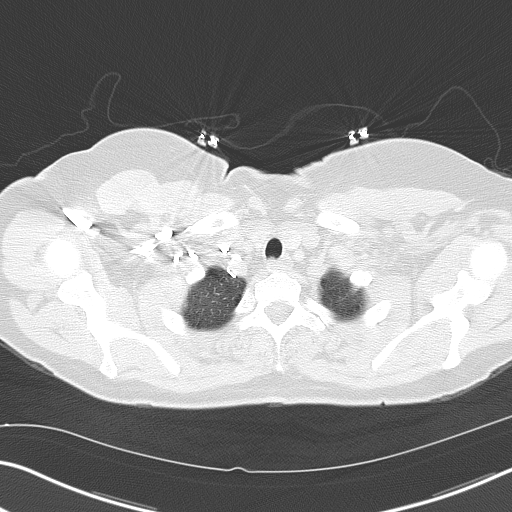

[14 of 32 positions shown; findings below may reference images not displayed]

FINDINGS: No mediastinal, hilar, or axillary lymphadenopathy. Small amount of fluid
surrounding the ascending aorta is like related to prominent pericardial
recess. The main pulmonary artery is enlarged, measuring 3.3 cm, as can be
seen with pulmonary arterial hypertension.

The thoracic aorta is normal in caliber. No pulmonary embolus identified.

There is minimal centrilobular emphysema. There is a 9 mm round nodule in
the right lower lobe which appears slightly increased in size from prior.
There is a 3 mm nodule at the right lung apex, similar to prior, image 12.

No aggressive lytic or sclerotic osseous lesions are identified.
IMPRESSION: 1. No pulmonary embolism identified.
2. Small nodule in the right lower lobe appears slightly increased in size
from prior. This is nonspecific, but malignancy is of differential concern.
Thoracic surgery consultation and PET CT are suggested.
3. Indeterminate 3 mm nodule in the right lung apex.
4. The main pulmonary artery is enlarged, which can be seen with pulmonary
arterial hypertension.

## 2013-09-08 ENCOUNTER — Ambulatory Visit: Payer: Self-pay | Admitting: Oncology

## 2013-12-18 IMAGING — CR DG CHEST 2V
1 series · 2 of 2 positions shown · non-contrast
Comparison: none

REASON FOR EXAM: Pre-op Respiratory and cardiac workup
COMMENTS:

[Series 1: pa · 0.17mm/px · 2 of 2 slices shown]
[im 1/2]
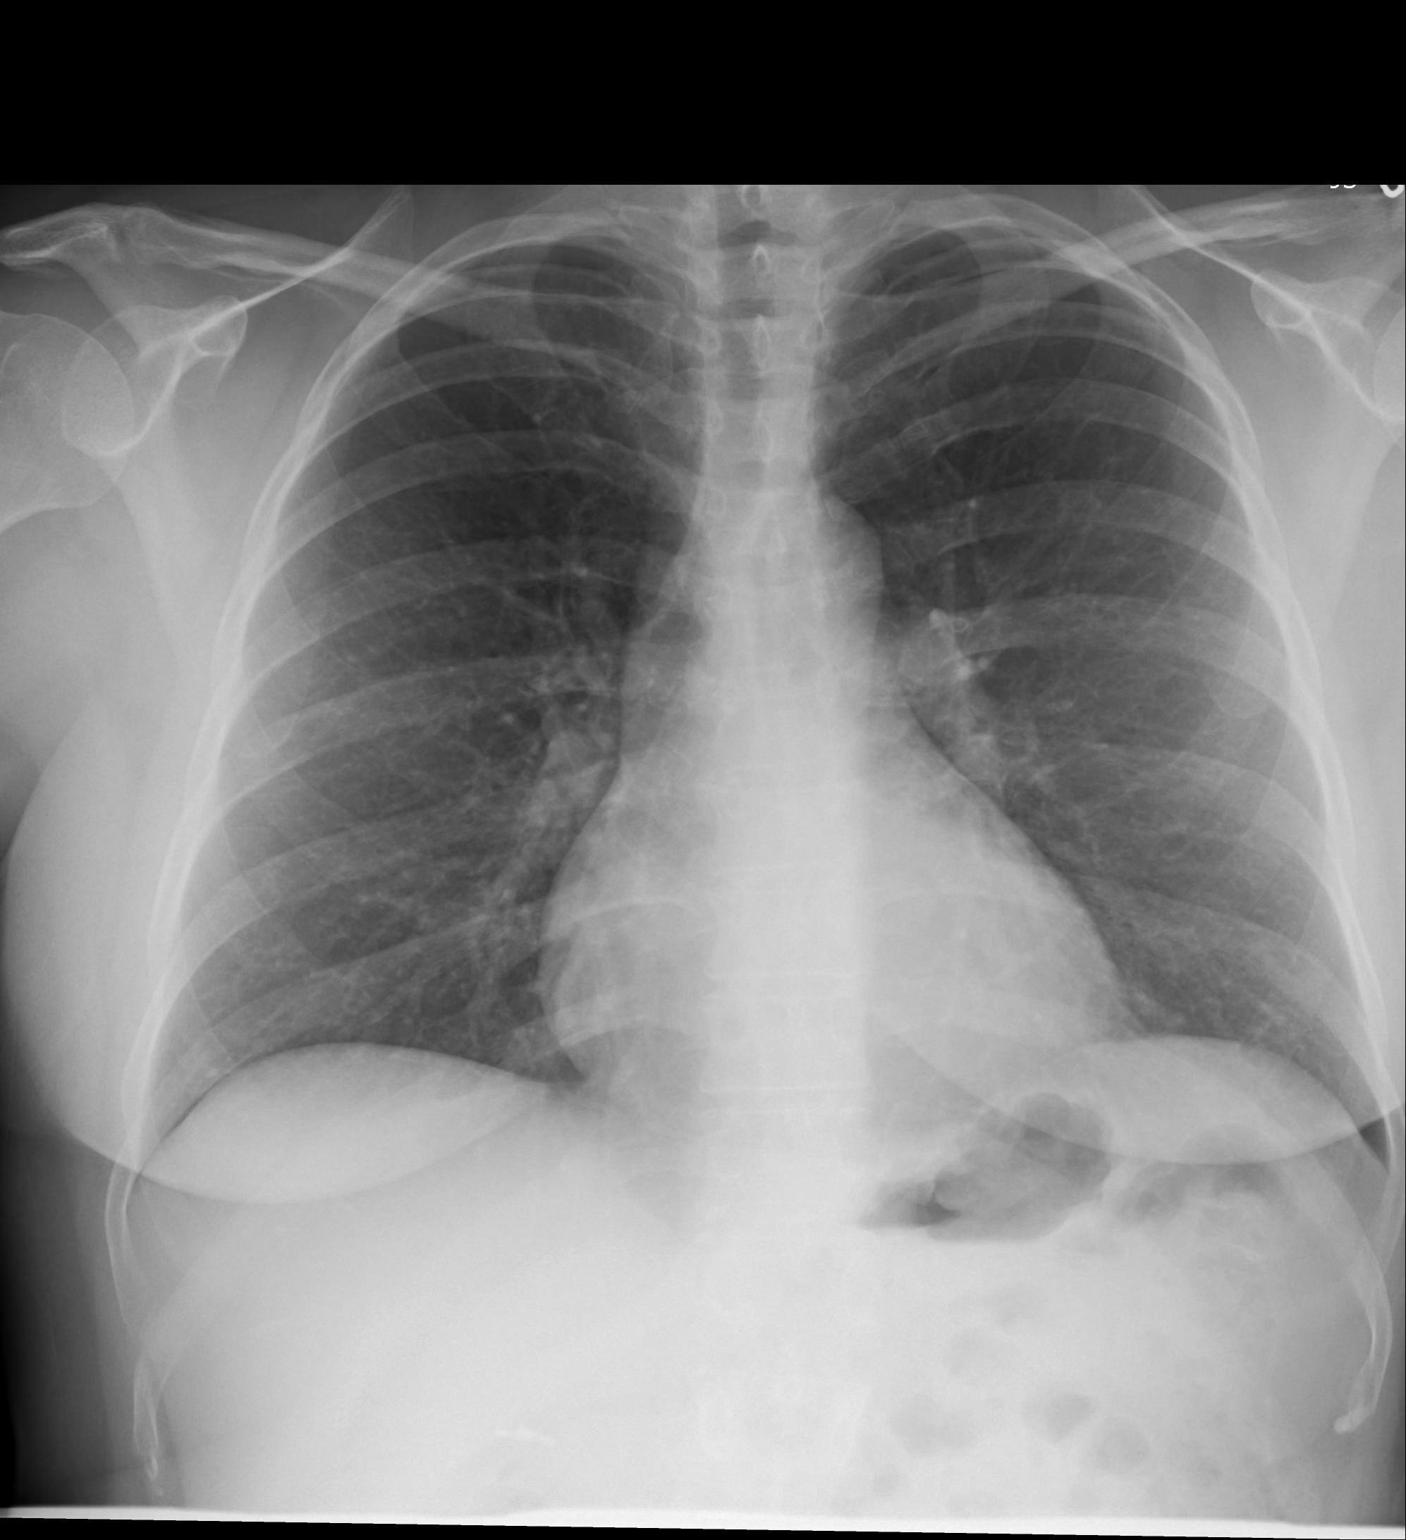
[im 2/2]
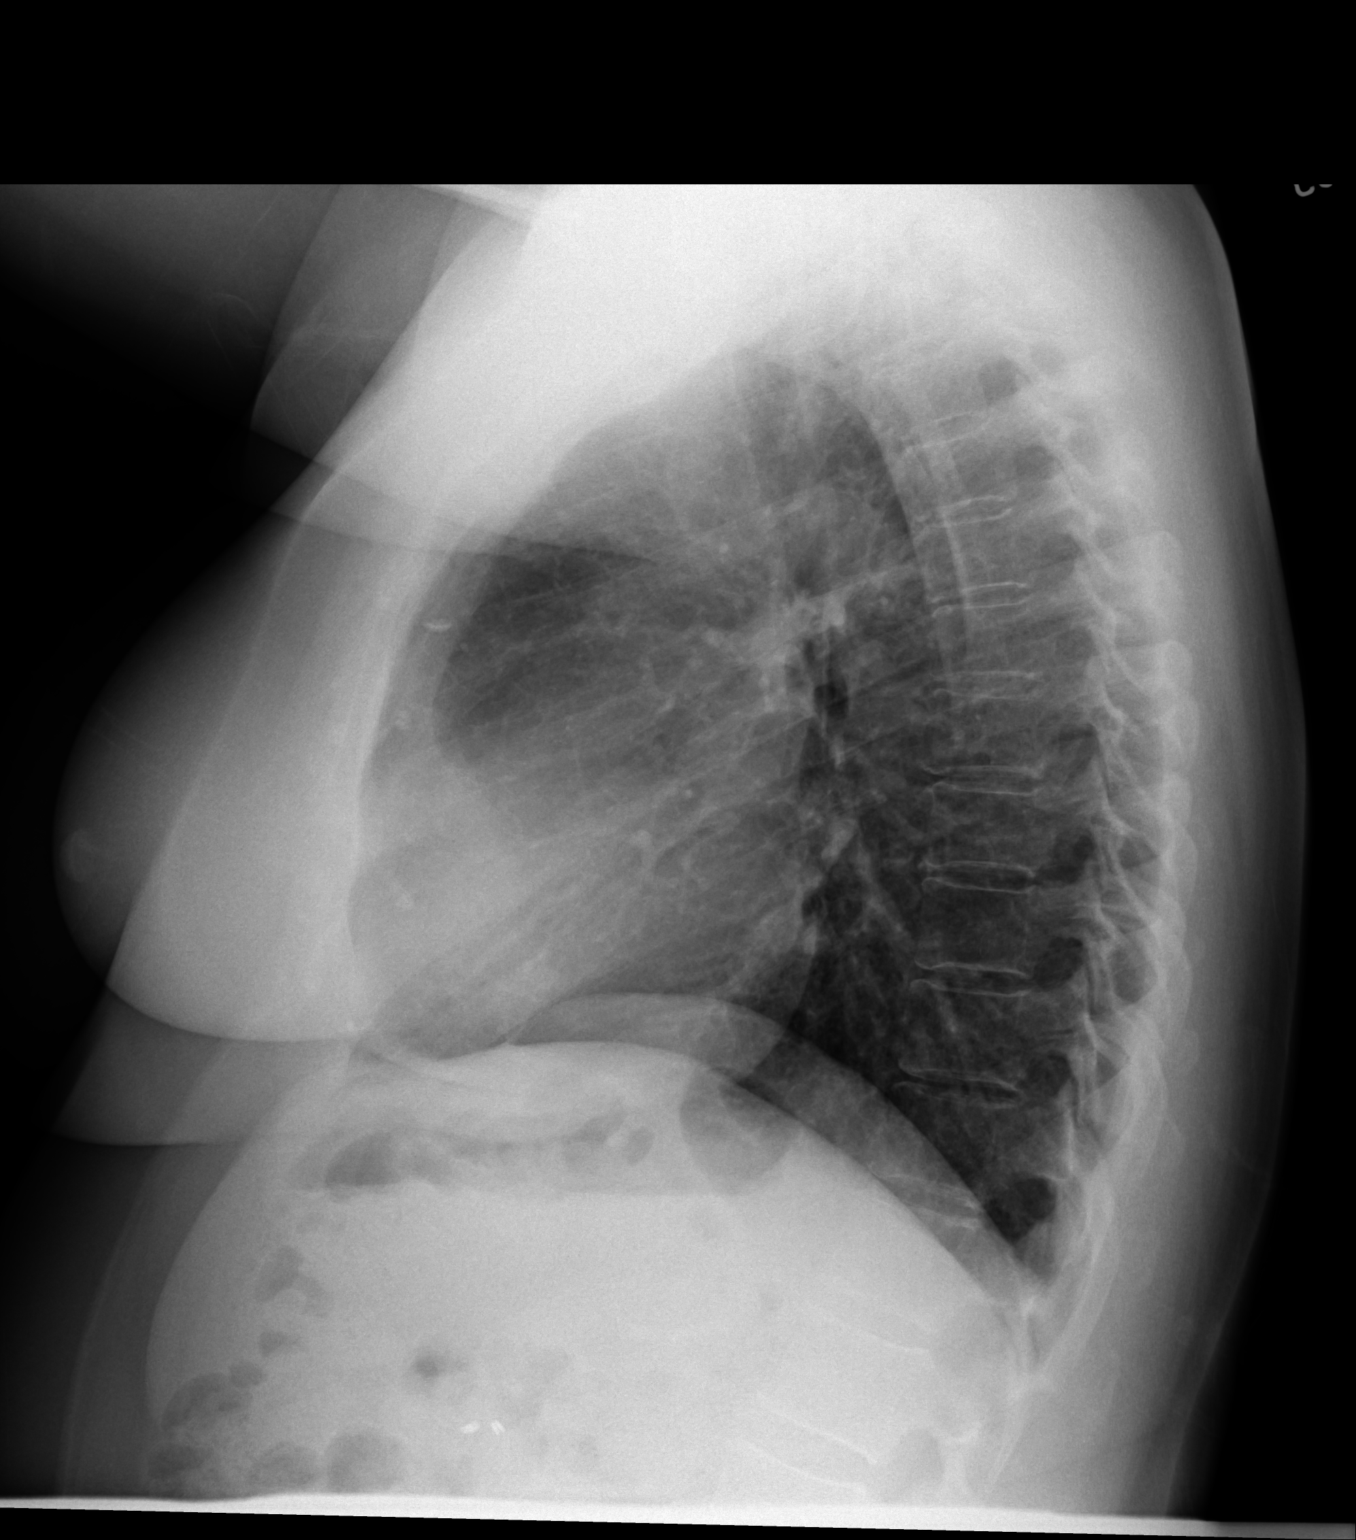

[2 of 2 positions shown; findings below may reference images not displayed]

PROCEDURE:     DXR - DXR CHEST PA (OR AP) AND LATERAL  - July 27, 2012 [DATE]

RESULT:     Comparison is made to the previous examination from 29 June, 2012.

The lungs are clear. The heart and pulmonary vessels are normal. The bony
and mediastinal structures are unremarkable. There is no effusion. There is
no pneumothorax or evidence of congestive failure.
IMPRESSION: No acute cardiopulmonary disease.

[REDACTED]

## 2014-01-16 ENCOUNTER — Emergency Department: Payer: Self-pay | Admitting: Emergency Medicine

## 2014-01-16 LAB — COMPREHENSIVE METABOLIC PANEL
ALK PHOS: 97 U/L
ALT: 18 U/L (ref 12–78)
ANION GAP: 4 — AB (ref 7–16)
Albumin: 3.8 g/dL (ref 3.4–5.0)
BUN: 11 mg/dL (ref 7–18)
Bilirubin,Total: 0.6 mg/dL (ref 0.2–1.0)
CALCIUM: 8.8 mg/dL (ref 8.5–10.1)
CO2: 28 mmol/L (ref 21–32)
CREATININE: 0.92 mg/dL (ref 0.60–1.30)
Chloride: 108 mmol/L — ABNORMAL HIGH (ref 98–107)
EGFR (Non-African Amer.): >60
GLUCOSE: 88 mg/dL (ref 65–99)
OSMOLALITY: 278 (ref 275–301)
Potassium: 4 mmol/L (ref 3.5–5.1)
SGOT(AST): 17 U/L (ref 15–37)
SODIUM: 140 mmol/L (ref 136–145)
TOTAL PROTEIN: 7.2 g/dL (ref 6.4–8.2)

## 2014-01-16 LAB — APTT: Activated PTT: 30 secs (ref 23.6–35.9)

## 2014-01-16 LAB — CK TOTAL AND CKMB (NOT AT ARMC)
CK, Total: 136 U/L
CK-MB: 1 ng/mL (ref 0.5–3.6)

## 2014-01-16 LAB — CBC
HCT: 36.1 % (ref 35.0–47.0)
HGB: 12.5 g/dL (ref 12.0–16.0)
MCH: 29.9 pg (ref 26.0–34.0)
MCHC: 34.7 g/dL (ref 32.0–36.0)
MCV: 86 fL (ref 80–100)
PLATELETS: 213 10*3/uL (ref 150–440)
RBC: 4.18 10*6/uL (ref 3.80–5.20)
RDW: 13.4 % (ref 11.5–14.5)
WBC: 6.5 10*3/uL (ref 3.6–11.0)

## 2014-01-16 LAB — PROTIME-INR
INR: 1
Prothrombin Time: 13.3 secs (ref 11.5–14.7)

## 2014-01-16 LAB — TROPONIN I
Troponin-I: <0.02 ng/mL
Troponin-I: <0.02 ng/mL

## 2014-01-16 LAB — PRO B NATRIURETIC PEPTIDE: B-TYPE NATIURETIC PEPTID: 51 pg/mL (ref 0–125)

## 2014-01-24 ENCOUNTER — Ambulatory Visit: Payer: Self-pay | Admitting: Oncology

## 2014-01-24 LAB — COMPREHENSIVE METABOLIC PANEL
Albumin: 3.9 g/dL (ref 3.4–5.0)
Alkaline Phosphatase: 92 U/L
Anion Gap: 7 (ref 7–16)
BILIRUBIN TOTAL: 0.9 mg/dL (ref 0.2–1.0)
BUN: 16 mg/dL (ref 7–18)
CALCIUM: 9.1 mg/dL (ref 8.5–10.1)
CO2: 29 mmol/L (ref 21–32)
CREATININE: 0.98 mg/dL (ref 0.60–1.30)
Chloride: 102 mmol/L (ref 98–107)
EGFR (African American): >60
GLUCOSE: 120 mg/dL — AB (ref 65–99)
OSMOLALITY: 278 (ref 275–301)
Potassium: 4.5 mmol/L (ref 3.5–5.1)
SGOT(AST): 16 U/L (ref 15–37)
SGPT (ALT): 19 U/L (ref 12–78)
SODIUM: 138 mmol/L (ref 136–145)
TOTAL PROTEIN: 7.4 g/dL (ref 6.4–8.2)

## 2014-01-24 LAB — CBC CANCER CENTER
BASOS ABS: 0.1 x10 3/mm (ref 0.0–0.1)
Basophil %: 1.1 %
EOS ABS: 0 x10 3/mm (ref 0.0–0.7)
Eosinophil %: 0.8 %
HCT: 38.5 % (ref 35.0–47.0)
HGB: 12.6 g/dL (ref 12.0–16.0)
LYMPHS PCT: 35 %
Lymphocyte #: 2.2 x10 3/mm (ref 1.0–3.6)
MCH: 28.5 pg (ref 26.0–34.0)
MCHC: 32.8 g/dL (ref 32.0–36.0)
MCV: 87 fL (ref 80–100)
MONO ABS: 0.4 x10 3/mm (ref 0.2–0.9)
Monocyte %: 6.4 %
NEUTROS PCT: 56.7 %
Neutrophil #: 3.5 x10 3/mm (ref 1.4–6.5)
Platelet: 236 x10 3/mm (ref 150–440)
RBC: 4.44 10*6/uL (ref 3.80–5.20)
RDW: 13 % (ref 11.5–14.5)
WBC: 6.2 x10 3/mm (ref 3.6–11.0)

## 2014-02-06 ENCOUNTER — Ambulatory Visit: Payer: Self-pay | Admitting: Oncology

## 2014-03-25 ENCOUNTER — Ambulatory Visit: Payer: Self-pay

## 2014-07-26 ENCOUNTER — Ambulatory Visit: Payer: Self-pay | Admitting: Oncology

## 2014-07-31 ENCOUNTER — Ambulatory Visit: Payer: Self-pay | Admitting: Oncology

## 2014-08-01 LAB — COMPREHENSIVE METABOLIC PANEL
AST: 18 U/L (ref 15–37)
Albumin: 3.9 g/dL (ref 3.4–5.0)
Alkaline Phosphatase: 98 U/L
Anion Gap: 9 (ref 7–16)
BILIRUBIN TOTAL: 0.6 mg/dL (ref 0.2–1.0)
BUN: 11 mg/dL (ref 7–18)
CHLORIDE: 98 mmol/L (ref 98–107)
Calcium, Total: 9.2 mg/dL (ref 8.5–10.1)
Co2: 28 mmol/L (ref 21–32)
Creatinine: 0.97 mg/dL (ref 0.60–1.30)
EGFR (African American): >60
GLUCOSE: 94 mg/dL (ref 65–99)
OSMOLALITY: 269 (ref 275–301)
POTASSIUM: 3.8 mmol/L (ref 3.5–5.1)
SGPT (ALT): 46 U/L
Sodium: 135 mmol/L — ABNORMAL LOW (ref 136–145)
TOTAL PROTEIN: 7 g/dL (ref 6.4–8.2)

## 2014-08-01 LAB — CBC CANCER CENTER
Basophil #: 0.1 x10 3/mm (ref 0.0–0.1)
Basophil %: 1.1 %
EOS ABS: 0.1 x10 3/mm (ref 0.0–0.7)
Eosinophil %: 1.2 %
HCT: 38.5 % (ref 35.0–47.0)
HGB: 12.6 g/dL (ref 12.0–16.0)
Lymphocyte #: 3 x10 3/mm (ref 1.0–3.6)
Lymphocyte %: 39.3 %
MCH: 28.4 pg (ref 26.0–34.0)
MCHC: 32.7 g/dL (ref 32.0–36.0)
MCV: 87 fL (ref 80–100)
MONO ABS: 0.5 x10 3/mm (ref 0.2–0.9)
MONOS PCT: 6.4 %
NEUTROS ABS: 4 x10 3/mm (ref 1.4–6.5)
NEUTROS PCT: 52 %
Platelet: 258 x10 3/mm (ref 150–440)
RBC: 4.43 10*6/uL (ref 3.80–5.20)
RDW: 12.7 % (ref 11.5–14.5)
WBC: 7.7 x10 3/mm (ref 3.6–11.0)

## 2014-08-08 ENCOUNTER — Ambulatory Visit: Payer: Self-pay | Admitting: Oncology

## 2015-01-14 ENCOUNTER — Ambulatory Visit
Admit: 2015-01-14 | Disposition: A | Discharge status: Home / Self Care | Payer: Self-pay | Attending: Oncology | Admitting: Oncology

## 2015-01-30 LAB — COMPREHENSIVE METABOLIC PANEL
ALBUMIN: 4.2 g/dL
ALT: 16 U/L
AST: 19 U/L
Alkaline Phosphatase: 78 U/L
Anion Gap: 6 — ABNORMAL LOW (ref 7–16)
BUN: 15 mg/dL
Bilirubin,Total: 0.8 mg/dL
CHLORIDE: 102 mmol/L
Calcium, Total: 8.8 mg/dL — ABNORMAL LOW
Co2: 29 mmol/L
Creatinine: 0.93 mg/dL
EGFR (African American): >60
Glucose: 83 mg/dL
POTASSIUM: 4.2 mmol/L
Sodium: 137 mmol/L
TOTAL PROTEIN: 7.6 g/dL

## 2015-01-30 LAB — CBC CANCER CENTER
BASOS ABS: 0.1 x10 3/mm (ref 0.0–0.1)
Basophil %: 1.3 %
Eosinophil #: 0.1 x10 3/mm (ref 0.0–0.7)
Eosinophil %: 1.4 %
HCT: 37.2 % (ref 35.0–47.0)
HGB: 12.4 g/dL (ref 12.0–16.0)
LYMPHS ABS: 2.2 x10 3/mm (ref 1.0–3.6)
LYMPHS PCT: 38.5 %
MCH: 28.4 pg (ref 26.0–34.0)
MCHC: 33.4 g/dL (ref 32.0–36.0)
MCV: 85 fL (ref 80–100)
Monocyte #: 0.4 x10 3/mm (ref 0.2–0.9)
Monocyte %: 7.3 %
NEUTROS ABS: 3 x10 3/mm (ref 1.4–6.5)
Neutrophil %: 51.5 %
Platelet: 246 x10 3/mm (ref 150–440)
RBC: 4.39 10*6/uL (ref 3.80–5.20)
RDW: 13.1 % (ref 11.5–14.5)
WBC: 5.7 x10 3/mm (ref 3.6–11.0)

## 2015-02-07 ENCOUNTER — Ambulatory Visit
Admit: 2015-02-07 | Disposition: A | Discharge status: Home / Self Care | Payer: Self-pay | Attending: Oncology | Admitting: Oncology

## 2015-02-21 ENCOUNTER — Other Ambulatory Visit: Payer: Self-pay | Admitting: Oncology

## 2015-02-21 DIAGNOSIS — J984 Other disorders of lung: Secondary | ICD-10-CM

## 2015-02-25 NOTE — Op Note (Signed)
PATIENT NAME:  Heather Zamora, Heather Zamora MR#:  [id number] DATE OF BIRTH:  1966/02/24  DATE OF PROCEDURE:  08/02/2012  PREOPERATIVE DIAGNOSIS: Carcinoid tumor of right lower lobe.  POSTOPERATIVE DIAGNOSIS: Carcinoid tumor of right lower lobe.  PROCEDURES PERFORMED:  1. Preoperative bronchoscopy to assess endobronchial anatomy.  2. Right thoracoscopy with conversion to thoracotomy and right lower lobe wedge resection.   SURGEON: Nestor Lewandowsky, MD  ASSISTANT: Dia Crawford, MD  INDICATIONS FOR PROCEDURE: Heather Zamora is a 49 year old woman with recently diagnosed right lower lobe carcinoid lesion. She was apprised of the indications and risks of surgical resection and she gave her informed consent.   DESCRIPTION OF PROCEDURE: The patient was brought to the operating suite and placed in the supine position. General endotracheal anesthesia was given with a double-lumen tube. Preoperative bronchoscopy was carried out. There were no major tumors or other abnormalities identified within the major airways. The double-lumen tube was positioned in the right mainstem bronchus and the right upper lobe bronchus was easily identified through the tube and the tube was then secured in proper position. The patient was then turned for thoracoscopy. All pressure points were carefully padded. The patient was prepped and draped in the usual sterile fashion.   We began by making a 15 mm incision in the lower aspect of the chest. This was done to accommodate a thoracoscopic port. Once this was done, we could see the lung but obviously could not feel the tumor. Two additional thoracoscopic ports were created, one anterior and one at the level of the tip of the scapula. Through these two ports we were able to place instruments and I could palpate the lesion deep into the right lower lobe substance of the lung. It was not possible to get the proper angles for placement of our stapler so we elected to open the chest. The two previous  thoracoscopic port sites were connected and the chest was opened slightly with a Tuffier retractor. Once this was done, we freed up the inferior pulmonary ligament and could easily feel the lesion within the right lower lobe. Using multiple fires of a gold echelon stapler, we excised the lesion from the right lower lobe. This was then sent to pathology for frozen section. 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 All en face margins were negative for cancer. The lower lobe was free of any significant hemorrhage and ProGel was used on the cut surfaces. All other lobes that we could feel did not show any evidence of any other pathology. A single chest tube was inserted to the apex and brought out through our thoracoscopic port. The chest was then closed with #2 Vicryl paracostal sutures. The serratus muscle was allowed to return to its normal anatomic position as it had not been divided. The latissimus was closed with #2 Vicryl, the subcutaneous tissues with 2-0 Vicryl and the skin with 3-0 Vicryl. Nylon was used on the thoracoscopy port and Vicryl on the thoracotomy port. The patient tolerated the procedure well and was extubated and taken to the recovery room in stable condition.  ____________________________ Lew Dawes Genevive Bi, MD teo:slb D: 08/02/2012 15:33:45 ET T: 08/02/2012 16:57:50 ET JOB#: [id number]  cc: Christia Reading E. Genevive Bi, MD, <Dictator> Armstead Peaks, MD Micheline Maze, MD Martie Lee. Oliva Bustard, MD Louis Matte MD ELECTRONICALLY SIGNED 08/24/2012 8:31

## 2015-02-25 NOTE — Consult Note (Signed)
Reason for Visit: This 49 year old Female patient presents to the clinic for initial evaluation of  Carcinoid of right lung .   Referred by Dr. Faith Rogue.  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   Pathology Report Pathology report has been requested for my review    Imaging Report Chest x-rays, PET/CT scan, and CT scans reviewed    Referral Report Clinical notes reviewed    Planned Treatment Regimen Possible stereotactic radiosurgery    HPI   patient is a 49 year old female who is having multiple medical problems including fever malaise and slight nonproductive cough. Part of her workup included a CT scan which showed a less than 1 cm nodule in the right lower lobe. This was avid on PET/CT. She was seen by pulmonologist in [place] who performed fine-needle aspiration positive for well-differentiated carcinoid. Patient does have problems with shortness of breath although was cleared by cardiology Dr. Nehemiah Massed forcardiac clearance. She does have an FEV1 of 58% of predicted. She was seen by Dr. Faith Rogue who was planning on performing at least a wedge resection of the lesion. Patient after much deliberation with her husband has decided against surgery and is seeking radiation oncology opinion at this time. She is having no significant cough hemoptysis or chest tightness. She does feel bloated. Recent chest x-rays have shown slight interval increase in size of the lesion. Her last chest CT was May 5 at our hospital.she does have from a cardiac standpoint a "leaky valve"  Past Hx:    leaky valve:    Vertigo:    pulmonary hypertension:    Hemorrhoids:    dizziness:    ovarian cyst removal: 1989   Cholecystectomy: 2000  Past, Family and Social History:   Past Medical History positive    Cardiovascular Insufficient atrial valve    Respiratory Pulmonary hypertension    Neurological/Psychiatric  Vertigo, dizziness    Past Surgical History cholecystectomy; Ovarian cyst removed,    Past Medical History Comments Hemorrhoids    Family History positive    Family History Comments Family history of colon and breast cancer    Social History positive    Social History Comments 30-pack-year smoking history has quit smoking cute. Social EtOH use history    Additional Past Medical and Surgical History Seen accompanied by her husband today   Allergies:   PCN: Itching, Hives  Hydromorphone: Hives, Itching  Home Meds:  Home Medications: Medication Instructions Status  tylenol '500mg'$  2 tabs oral prn Active   Review of Systems:   General negative    Performance Status (ECOG) 0    Skin negative    Breast negative    Ophthalmologic negative    ENMT negative    Respiratory and Thorax see HPI    Cardiovascular see HPI    Gastrointestinal negative    Genitourinary negative    Musculoskeletal negative    Neurological negative    Psychiatric negative    Hematology/Lymphatics negative    Endocrine negative    Allergic/Immunologic negative    Review of Systems   aside from the bloating, leak ER valve, some slight shortness of breath and pulmonary hypertensionPatient denies any weight loss, fatigue, weakness, fever, chills or night sweats. Patient denies any loss of vision, blurred vision. Patient denies any ringing  of the ears or hearing loss. No irregular heartbeat. Patient denies heart murmur or history of fainting. Patient denies any  chest pain or pain radiating to her upper extremities. Patient denies any shortness of breath, difficulty breathing at night, cough or hemoptysis. Patient denies any swelling in the lower legs. Patient denies any nausea vomiting, vomiting of blood, or coffee ground material in the vomitus. Patient denies any stomach pain. Patient states has had normal bowel movements no significant constipation or diarrhea. Patient denies any dysuria,  hematuria or significant nocturia. Patient denies any problems walking, swelling in the joints or loss of balance. Patient denies any skin changes, loss of hair or loss of weight. Patient denies any excessive worrying or anxiety or significant depression. Patient denies any problems with insomnia. Patient denies excessive thirst, polyuria, polydipsia. Patient denies any swollen glands, patient denies easy bruising or easy bleeding. Patient denies any recent infections, allergies or URI. Patient "s visual fields have not changed significantly in recent time.  Physical Exam:  General/Skin/HEENT:   General normal    Skin normal    Eyes normal    ENMT normal    Head and Neck normal    Additional PE Well-developed slightly obese female in NAD. Lungs are clear to A&P cardiac examination shows regular rate and rhythm. Abdomen is benign. No peripheral adenopathy is identified.   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 Faith Rogue and Dr. Oliva Bustard. I believe she would be a good candidate for stereotactic radiosurgery to this lesion. I would like to reevaluate her when her machine has been commissioned in the next several weeks and I have asked to see her back in about a month and half and we will repeat CT scan with contrast at that time for true tumor delineation. At that time we will be planning a stereotactic radiosurgery. Although carcinoid tumors or not normally treated with for outlined radiation patient irefuses surgical intervention. I have explained to her that my #1 treatment option would be surgical resection although this is also explained to  Dr. Oliva Bustard yet she still refuses surgery. I have explained to her that S. radiosurgery does not work surgical salvage would be another alternative. Will reevaluate her in about a month and a half and repeat CT scan at that time and start planning for stereotactic radiosurgery.  I would like to take this opportunity to thank you for allowing me to continue to participate in this patient's care.  CC Referral:   cc: Dr. Delight Stare   Electronic Signatures: Baruch Gouty, Roda Shutters (MD)  (Signed 12-Sep-13 16:36)  Authored: HPI, Diagnosis, Past Hx, PFSH, Allergies, Home Meds, ROS, Physical Exam, Encounter Assessment and Plan, CC Referring Physician   Last Updated: 12-Sep-13 16:36 by Armstead Peaks (MD)

## 2015-02-25 NOTE — Discharge Summary (Signed)
PATIENT NAME:  Heather Zamora, Heather Zamora MR#:  [id number] DATE OF BIRTH:  Dec 22, 1965  DATE OF ADMISSION:  08/02/2012 DATE OF DISCHARGE:  08/07/2012  ADMITTING DIAGNOSIS: Right lower lobe mass (carcinoid tumor).   DISCHARGE DIAGNOSIS: Right lower lobe carcinoid tumor.   OPERATION PERFORMED: Right thoracotomy and wedge resection of right lower lobe mass.   HOSPITAL COURSE: [name] is a 49 year old woman who was found to have a right lower lobe mass on chest x-ray and CT scanning. She underwent a percutaneous biopsy which revealed a carcinoid tumor of the right lower lobe. She was instructed on the indications and risks of surgery and she elected to undergo thoracotomy with wedge resection. This was accomplished on 08/02/2012. Her postoperative course was essentially unremarkable and her chest tube was removed on September 29th and she was discharged to home on 08/07/2012. Discharge prescriptions included Roxicodone 5 mg every four hours as needed for pain, dispense #40. She was instructed on wound care and she was instructed on follow-up. She will see Ms. Georgeanne Nim, nurse practitioner, and Dr. Forest Gleason next week in the [place]. She was instructed to call them.   ____________________________ Lew Dawes. Genevive Bi, MD teo:drc D: 08/07/2012 09:28:35 ET T: 08/07/2012 10:40:05 ET JOB#: [id number]  cc: Christia Reading E. Genevive Bi, MD, <Dictator> Louis Matte MD ELECTRONICALLY SIGNED 08/24/2012 8:31

## 2015-02-28 NOTE — H&P (Signed)
PATIENT NAME:  Heather Zamora, Heather Zamora MR#:  [id number] DATE OF BIRTH:  19-May-1966  DATE OF ADMISSION:  04/07/2013  PRIMARY CARE PHYSICIAN:  [facility].   REFERRING PHYSICIAN:  Dr. Marta Antu.   CHIEF COMPLAINT:  Chest pain.   HISTORY OF PRESENT ILLNESS:  Mrs. 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  The patient was in her usual state of health until last evening around 9:30 p.m. while she was sitting she developed chest pain located at the most part at the upper mid chest, described as squeezing quality with severity reaching 10 on a scale of 10.  It lasted one hour and by that time she came to the Emergency Department.  She received sublingual nitroglycerin and the pain eased off to a level of 4 on a scale of 10.  The patient tells me that there is a pleuritic component to it, that is the pain gets worse when she takes a deep breath.  At the end of the conversation she tells me that the pain in fact was there for the last three weeks, but was on and off and at one time she came to the Emergency Department for evaluation, but then work-up was negative and she was discharged home.   REVIEW OF SYSTEMS:  CONSTITUTIONAL:  Denies having any fever.  No chills.  No fatigue.  EYES:  No blurring of vision.  No double vision.  EARS, NOSE, THROAT:  No hearing impairment.  No sore throat.  No dysphagia.  CARDIOVASCULAR:  Chest pain as reported above.  She is unsure about shortness of breath.  No palpitations.  No syncope.  RESPIRATORY:  Chest pain.  No hemoptysis.  No cough.  GASTROINTESTINAL:  No abdominal pain.  No vomiting.  No diarrhea.  GENITOURINARY:  No dysuria.  No frequency of urination.  Her last menstrual period was two years ago.  She is in menopause now.  MUSCULOSKELETAL:  No joint pain or swelling.  No muscular pain or swelling.  INTEGUMENTARY:  No skin rash.  No ulcers.  NEUROLOGY:  No  focal weakness.  No seizure activity.  No headache.  PSYCHIATRY:  No anxiety.  No depression.  ENDOCRINE:  No polyuria or polydipsia.  No heat or cold intolerance.   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  Ex-chronic smoker.   PAST SURGICAL HISTORY:  Right lung resection for carcinoid tumor and history of cholecystectomy and cyst removal from the ovary.   FAMILY HISTORY:  Her father at the age of 28 had coronary artery disease, underwent bypass and he had pacemaker.   SOCIAL HABITS:  Ex-chronic smoker.  She quit two years ago.  Prior to that, she used to smoke since age of 35.  No history of alcohol or drug abuse.   SOCIAL HISTORY:  She is unemployed, married and living with her husband.   ADMISSION MEDICATIONS:  Ibuprofen 800 mg 3 times a day as needed.    ALLERGIES:  HYDROMORPHONE CAUSING SKIN RASH AND ITCHING.  PENICILLIN CAUSING ITCHING AND SKIN RASH.   PHYSICAL EXAMINATION: VITAL SIGNS:  Blood pressure 95/52, respiratory rate 12, pulse 68, temperature 98.6.  Oxygen saturation 100%.  GENERAL APPEARANCE:  Young female lying in bed in no acute distress at time of my examination. HEAD AND NECK:  No pallor.  No icterus.  No cyanosis.  EARS, NOSE, THROAT:  Ear  examination revealed normal hearing, no discharge, no ulcers.  Examination of the nose showed no bleeding, no ulcers.  Oropharyngeal examination, normal lips and tongue.  No oral thrush, no ulcers.  EYES:  Examination revealed normal eyelids and conjunctivae.  Pupils about 5 to 6 mm, round, equal and reactive to light.  NECK:  Supple.  Trachea at midline.  No thyromegaly.  No cervical lymphadenopathy.  No masses.  HEART:  Revealed normal S1, S2.  No S3, S4.  No murmur.  Distant heart sounds.  No gallop.  No carotid bruits.  RESPIRATORY:  Normal breathing pattern without use of accessory muscles.  No rales.  No wheezing.  No localized tenderness.  ABDOMEN:  Soft without tenderness.  No  hepatosplenomegaly.  No masses.  No hernias.  SKIN:  No ulcers.  No subcutaneous nodules.  MUSCULOSKELETAL:  No joint swelling.  No clubbing.  NEUROLOGIC:  Cranial nerves II through XII are intact.  No focal motor deficit.  PSYCHIATRIC:  The patient is alert, oriented x 3.  Mood and affect were normal.   LABORATORY FINDINGS:  Her EKG showed normal sinus rhythm at rate of 68 per minute.  Low-voltage.  Nonspecific T wave inversion in leads V1, V2, V3, otherwise unremarkable.  Chest x-ray showed no acute cardiopulmonary abnormalities.  Serum glucose 87, BUN 12, creatinine 1, sodium 141, potassium 4, total CPK 126, troponin less than 0.02.  CBC showed a white count of 8000, hemoglobin 11, hematocrit 34, platelet count 218.  D-dimer was 0.25.  ASSESSMENT:  1.  Severe pleuritic chest pain.  Differential diagnosis will include cardiac and noncardiac causes including pulmonary embolus, however musculoskeletal pain is still a possibility.  Also, gastrointestinal causes for chest pain could respond to sublingual nitroglycerin as well.  2.  History of lung cancer.  It was carcinoid.    3.  Ex-chronic smoker.  PLAN:  We will admit the patient to telemetry for observation.  Follow up on cardiac enzymes.  Given the severity of the pain and the pleuritic component and given her prior history of cancer would like to rule out pulmonary embolism, although it is less likely given the negative predicted value of D-dimer.  If the CTA of chest is negative and troponins are negative then she may need a stress test.  Right now, we will focus on pain control and monitor on telemetry.   Time spent in evaluating this patient took more than 55 minutes.    ____________________________ Clovis Pu. Lenore Manner, MD amd:ea D: 04/08/2013 03:28:19 ET T: 04/08/2013 04:20:59 ET JOB#: [id number]  cc: Clovis Pu. Lenore Manner, MD, <Dictator> Mike Craze Irven Coe MD ELECTRONICALLY SIGNED 04/09/2013 1:07

## 2015-02-28 NOTE — Consult Note (Signed)
HPI: This 49 year old Female patient with history of lung nodule followed by Dr. Oliva Bustard, with complaints of chronic back pain withepisodes recently with fally.    Subjective:pain has been present for over 10 years. more progressive recently.    Review of Systems:  General: weakness  fatigue  Performance Status (ECOG): 1  HEENT: no complaints  Lungs: cough  SOB  Cardiac: no complaints  GI: no complaints  GU: no complaints  Musculoskeletal: muscle ache  Extremities: no complaints  Skin: no complaints  Neuro: no complaints  Psych: anxiety  depression  Pain ?: Yes  Pain- Qualitative: Mild   Allergies:  PCN: Itching, Hives  Hydromorphone: Hives, Itching  Preventive Screening:  Has patient had any of the following test? Mammography (1)   Last Mammography: March 2014(1)   Smoking History: Smoking History Quit May 2013(1)  PFSH: Comments: No family history of colorectal cancer, breast cancer, or ovarian cancer.  Comments: chronic smoker.  Does not drink  Additional Past Medical and Surgical History: Moderate obesity   Home Medications: Medication Instructions Last Modified Date/Time  citalopram 10 mg oral tablet 1 tab(s) orally once a day 11-Aug-14 10:54  omeprazole 20 mg oral delayed release capsule 1 cap(s) orally once a day 11-Aug-14 10:54  ibuprofen 600 mg oral tablet 1 tab(s) orally 3 times a day 11-Aug-14 10:54  Truvada 200 mg-300 mg oral tablet 1 tab(s) orally once a day 11-Aug-14 10:54  traMADol 50 mg oral tablet 1 tab(s) orally every 4 hours, As Needed 11-Aug-14 10:54  gabapentin 300 mg oral capsule 1 cap(s) orally 3 times a day 11-Aug-14 10:54   Vital Signs:  scanned in the system.   Physical Exam:  General: well developed, well nourished, and no acute distress.moderately obese  Mental Status: alert and oriented to person, place and time  Neurological: normal motor exam, gait, 5 x 5, strength, grip, pressure, flexor, extensor. good strength in both upper and  lower extremity. Was seen ambulating too an fro in examination room. Able to get up without any assistance.    Radiographic examination. Reviewed her most recent mri, she does have disc degeration at L3-4,L4-5 and L5-s1. worst effected is L4-5.   Chronic Low back pain with acute exacerbation try conservative approach. Referral  for physical therapy for back strengthening, core excercise,balanceIf  she needs in future might send to pain managment.                  Electronic Signatures: Shirlean Mylar (MD)  (Signed on 02-Sep-14 11:21)  Authored  Last Updated: 02-Sep-14 11:21 by Shirlean Mylar (MD)

## 2015-02-28 NOTE — Discharge Summary (Signed)
PATIENT NAME:  Heather Zamora, Heather Zamora MR#:  [id number] DATE OF BIRTH:  16-Nov-1965  DATE OF ADMISSION:  04/08/2013 DATE OF DISCHARGE:  04/08/2013  PRIMARY CARE PHYSICIAN: Dr. Delight Stare.   DISCHARGE DIAGNOSIS: Musculoskeletal chest pain.   IMAGING STUDIES DONE: Include CT of scan of the chest which showed no pulmonary embolism.   ADMITTING HISTORY AND PHYSICAL AND HOSPITAL COURSE: Please see detailed H and P dictated by Dr. Lenore Manner. In brief, a 49 year old female patient with a prior history of carcinoid tumor of right lung with resection, presented to the Emergency Room complaining of midsternal chest pain. The patient had 3 sets of cardiac enzymes done. This had a pleuritic nature. With negative cardiac enzymes and normal EKG, the patient with musculoskeletal chest pain was started on scheduled ibuprofen for 4 days and is being discharged home.   I have advised the patient to follow up with her primary care physician in a week. In case her pain does not resolve or gets worse, the patient may need an outpatient stress test. Presently, negative cardiac enzymes and normal EKG.   DISCHARGE MEDICATIONS: Include ibuprofen 600 mg oral 3 times a day scheduled for 4 days and later p.r.n.   DISCHARGE INSTRUCTIONS: Regular diet, activity as tolerated and follow-up with primary care physician in a week.   TIME SPENT ON DAY OF DISCHARGE IN DISCHARGE ACTIVITY: 40 minutes.    ____________________________ Leia Alf Izabela Ow, MD srs:gb D: 04/08/2013 15:32:08 ET T: 04/09/2013 04:16:15 ET JOB#: [id number]  cc: Alveta Heimlich R. Darvin Neighbours, MD, <Dictator> Marguerita Merles, MD Neita Carp MD ELECTRONICALLY SIGNED 05/01/2013 10:45

## 2015-03-02 NOTE — H&P (Signed)
PATIENT NAME:  Heather Zamora, Heather Zamora MR#:  [id number] DATE OF BIRTH:  1966/02/07  DATE OF ADMISSION:  03/21/2012  ADDENDUM  Patient has been having some vertiginous symptoms for a week also, like dizziness and room spinning around. I will also start her on meclizine for that and also echo will tell us if all the symptoms of dizziness and chest pain are pulmonary hypertension is causing that.   ____________________________ Mena Pauls, MD ag:cms D: 03/21/2012 19:06:56 ET T: 03/22/2012 06:04:08 ET JOB#: [id number]  cc: Mena Pauls, MD, <Dictator> Mena Pauls MD ELECTRONICALLY SIGNED 04/15/2012 13:51

## 2015-03-02 NOTE — Discharge Summary (Signed)
PATIENT NAME:  Heather Zamora, Heather Zamora MR#:  [id number] DATE OF BIRTH:  [birth year]  DATE OF ADMISSION:  03/21/2012 DATE OF DISCHARGE:  03/22/2012  ADMISSION DIAGNOSIS: Acute chest pain.   DISCHARGE DIAGNOSES:  1. Chest pain.  2. Benign positional vertigo.  3. Pulmonary nodule.  4. Smoking dependence.   CONSULTANTS: None.   IMAGING: The patient underwent a nuclear medicine stress test which was negative for acute ischemia.   Echocardiogram showed ejection fraction of 50%, mild MR, and mild to moderate TR.   LABS: Cardiac enzymes were negative.   HOSPITAL COURSE: This is a 49 year old female who presented with chest pain and dizziness. For further details, please refer to the history and physical.  1. Chest pain. The patient was admitted to telemetry services. Her troponins were all negative. Her EKG did not show evidence of acute cardiac event. She underwent a Myoview which was essentially normal. She had no chest pain throughout her hospitalization.  2. Vertigo, likely benign positional as her symptoms worsened when she turned to the right. We are using supportive measures with meclizine.  3. Pulmonary nodule as seen on CT scan in a smoker. The patient will have outpatient follow-up with Dr. Oliva Bustard. I have already spoken with Dr. Oliva Bustard regarding this patient.  4. Smoking dependence. I encouraged the patient to stop smoking. I have discharged her with a nicotine patch.   DISCHARGE MEDICATIONS:  1. Nicotine 7 mg/24 hours.  2. Meclizine 12.5 mg every eight hours p.r.n. dizziness.   DISCHARGE DIET: Low sodium.   DISCHARGE ACTIVITY: As tolerated.   DISCHARGE FOLLOWUP: The patient will follow-up with Dr. Oliva Bustard on 03/28/2012 at 11 o'clock.   TIME SPENT: Approximately 35 minutes. ____________________________ Donell Beers. Benjie Karvonen, MD spm:slb D: 03/22/2012 14:14:59 ET T: 03/22/2012 16:38:31 ET JOB#: [id number]  cc: Susy Placzek P. Benjie Karvonen, MD, <Dictator> Martie Lee. Oliva Bustard, MD Trang Bouse P Maurina Fawaz  MD ELECTRONICALLY SIGNED 03/23/2012 21:09

## 2015-03-02 NOTE — H&P (Signed)
PATIENT NAME:  Heather Zamora, Heather Zamora MR#:  [id number] DATE OF BIRTH:  June 23, 1966  DATE OF ADMISSION:  03/21/2012  PRIMARY CARE PHYSICIAN: [facility]    CHIEF COMPLAINT: Chest pain.   HISTORY OF PRESENT ILLNESS: The patient is a 49 year old female fairly healthy. She has no history of hypertension, diabetes, coronary artery disease, or MI in the past. She says that she has been dizzy for the last one week. She is complaining of some dizziness and room spinning around when she tries to bend over or moves her head from side to side. This morning she was dizzy, slightly nauseated, but she went to [employer] around 1 o'clock in the afternoon and she started having severe chest pain. She is complaining of midsternal chest pressure, squeezing kind of pain, nonradiating. She also could not have a deep breath at that time because of the chest pain. She was nauseated. She said she broke into cold sweats. She tried to get out of [employer] as soon as possible and then go back home. She continued to have chest pain en route to her home so she called EMS. She never had chest pain before. She said it still feels like squeezing, someone sitting on her chest. She received nitroglycerin and aspirin by EMS but she says the nitroglycerin did not help the chest pain. She got 4 mg of IV morphine in the Emergency Room and on that also did not help the chest pain. She also got 4 mg of IV Zofran. She never had any cardiac evaluation in the past. She said she already had a cholecystectomy. She had mild right subcostal pain when she came in but that has resolved. She denies any acid reflux. She denies any abdominal pain. She had a CT of the chest done in the Emergency Room that does not show any PE. It again showed that she has a small nodule which has slightly increased from before. She said she normally mows the lawn. She walks without any chest pain or shortness of breath.   REVIEW OF SYSTEMS: She denies any fever or weakness. No  acute change in vision but complaining of headache and dizziness. No cough. No pleuritic chest pain. No COPD. She presents with chest pain. No palpitations or syncope. She had some nausea. No vomiting, diarrhea, abdominal pain. No GI bleed or dysuria. No frequency. No thyroid problems. No rash. No joint pains. No swelling. No focal numbness or weakness. No anxiety.   PAST MEDICAL HISTORY: She has no significant past medical history. No chronic medical problems. She only has a pulmonary nodule which is being followed by serial CAT scans. It was initially found in June of 2012. She had a 7 mm nodule in the right lower lobe, then she had another CT scan done in October of 2012 which showed a marginated soft tissue mass in the right lower lobe about 7 mm.   PAST SURGICAL HISTORY:  1. Cholecystectomy.  2. Cyst removed from the ovary.   ALLERGIES: Penicillin.   HOME MEDICATIONS: None.   SOCIAL HISTORY: She lives with her husband. She has a long-term smoking history since 49 years of age. She quit smoking for five years in between but restarted smoking in October. No alcohol or drug use.   FAMILY HISTORY: Her father had coronary artery bypass graft in 60's, had a pacemaker. Also, her brother had some chest pain and also he takes nitroglycerin.   PHYSICAL EXAMINATION:   VITAL SIGNS: Temperature 97.3, heart rate 64, respiratory rate  20, blood pressure 120/68, saturating 100% on room air.  GENERAL: This is a middle-aged obese Caucasian female. She is still having some chest pressure, not in acute distress.   HEENT: Bilateral pupils are equal and reactive to light. Extraocular muscles are intact. No scleral icterus. No conjunctivitis. Oral mucosa is moist. No pallor. On bilateral otoscopic examination she has some wax. The tympanic membrane appears to be non-bulging.   NECK: No thyroid tenderness, enlargement, or nodules. Neck is supple. No masses, nontender. No adenopathy. No JVD. No carotid bruit.    CHEST: Bilateral breath sounds are clear. No wheeze. Normal effort. No respiratory distress.   HEART: Heart sounds are regular. No murmur. Good peripheral pulses. No lower extremity edema. Chest is nontender.   ABDOMEN: Soft, nontender. Normal bowel sounds. No hepatosplenomegaly. No bruit. No masses. Murphy sign is negative.   RECTAL: Deferred.   NEUROLOGIC: She is awake, alert, oriented to time, place, and person. Cranial nerves are intact. Moving all extremities against gravity.   EXTREMITIES: No cyanosis. No clubbing.   SKIN: No rash. No lesions.   LABORATORY, DIAGNOSTIC, AND RADIOLOGICAL DATA: White count 7.7, hemoglobin 12.8, platelet count 210,000. BMP sodium 142, potassium 4, BUN 8, creatinine 0.93. CK 92. Troponin is negative. Urine pregnancy test is negative.   CT of the chest shows no PE. Main pulmonary artery is enlarged which can be seen with pulmonary arterial hypertension. Small nodule in the right lower lobe, slightly increased size from before.   EKG showed sinus bradycardia, low voltage QRS axis, no acute ischemic changes.   IMPRESSION:  1. Chest pain with both typical and atypical features, rule out MI, rule out coronary artery disease. 2. Pulmonary nodule slightly increased with smoking history. 3. Tobacco abuse.   PLAN: This is a 50 year old female who is a smoker with no significant chronic medical problems of hypertension, diabetes, or hyperlipidemia. She has family history of early coronary artery disease. She comes in with chest pain as a squeezing pressure-like chest pain which did not improve with nitroglycerin and did not improve much with morphine. She has associated shortness of breath and diaphoresis with that. I'm going to admit her to telemetry. She already received aspirin. I'm going to place nitro paste on her. Will continue some morphine p.r.n. If her cardiac enzymes are negative, I'm going to get a Myoview in the morning considering her smoking history  and family history of coronary artery disease. Will also check a lipid profile. She is bradycardic at this time but not on any beta-blockers at this time. Will hold off anticoagulation at this time. I did a repeat EKG on her and the repeat EKG did not show any acute ischemic changes. She has some flattening of the T waves in V2 and V3. I'm also going to check an echocardiogram on her considering enlarged pulmonary artery on the CT scan to see the right ventricle pressures on her. Will also get an Oncology consult on her with her pulmonary nodule which has increased in size with smoking history. The patient may need a PET scan and may need a biopsy later on. She may need to follow-up with Oncology as outpatient as well. I advised smoking cessation.   Plan was discussed with the patient and the husband in detail.   TIME SPENT WITH ADMISSION AND COORDINATION OF CARE: 50 minutes.   ____________________________ Mena Pauls, MD ag:drc D: 03/21/2012 18:55:01 ET T: 03/22/2012 05:49:11 ET JOB#: [id number]  cc: Mena Pauls, MD, <Dictator> [place]  MD ELECTRONICALLY SIGNED 04/15/2012 13:51

## 2015-03-16 ENCOUNTER — Emergency Department
Admission: EM | Admit: 2015-03-16 | Discharge: 2015-03-16 | Disposition: A | Discharge status: Home / Self Care | Payer: Medicaid Other | Attending: Emergency Medicine | Admitting: Emergency Medicine

## 2015-03-16 ENCOUNTER — Other Ambulatory Visit: Payer: Self-pay

## 2015-03-16 ENCOUNTER — Encounter: Payer: Self-pay | Admitting: Emergency Medicine

## 2015-03-16 DIAGNOSIS — L03311 Cellulitis of abdominal wall: Secondary | ICD-10-CM

## 2015-03-16 DIAGNOSIS — Y998 Other external cause status: Secondary | ICD-10-CM | POA: Insufficient documentation

## 2015-03-16 DIAGNOSIS — Z880 Allergy status to penicillin: Secondary | ICD-10-CM | POA: Insufficient documentation

## 2015-03-16 DIAGNOSIS — Y9389 Activity, other specified: Secondary | ICD-10-CM | POA: Insufficient documentation

## 2015-03-16 DIAGNOSIS — Y9289 Other specified places as the place of occurrence of the external cause: Secondary | ICD-10-CM | POA: Insufficient documentation

## 2015-03-16 DIAGNOSIS — R51 Headache: Secondary | ICD-10-CM | POA: Insufficient documentation

## 2015-03-16 DIAGNOSIS — R5383 Other fatigue: Secondary | ICD-10-CM | POA: Insufficient documentation

## 2015-03-16 DIAGNOSIS — R079 Chest pain, unspecified: Secondary | ICD-10-CM | POA: Insufficient documentation

## 2015-03-16 DIAGNOSIS — W57XXXA Bitten or stung by nonvenomous insect and other nonvenomous arthropods, initial encounter: Secondary | ICD-10-CM

## 2015-03-16 DIAGNOSIS — S30861A Insect bite (nonvenomous) of abdominal wall, initial encounter: Secondary | ICD-10-CM | POA: Insufficient documentation

## 2015-03-16 DIAGNOSIS — M791 Myalgia: Secondary | ICD-10-CM | POA: Insufficient documentation

## 2015-03-16 HISTORY — DX: Malignant (primary) neoplasm, unspecified: C80.1

## 2015-03-16 LAB — BASIC METABOLIC PANEL
ANION GAP: 6 (ref 5–15)
BUN: 10 mg/dL (ref 6–20)
CO2: 27 mmol/L (ref 22–32)
Calcium: 9.2 mg/dL (ref 8.9–10.3)
Chloride: 107 mmol/L (ref 101–111)
Creatinine, Ser: 0.9 mg/dL (ref 0.44–1.00)
GFR calc non Af Amer: >60 mL/min (ref >60)
Glucose, Bld: 90 mg/dL (ref 65–99)
Potassium: 4.4 mmol/L (ref 3.5–5.1)
SODIUM: 140 mmol/L (ref 135–145)

## 2015-03-16 LAB — CBC
HEMATOCRIT: 37.1 % (ref 35.0–47.0)
HEMOGLOBIN: 12.3 g/dL (ref 12.0–16.0)
MCH: 28.6 pg (ref 26.0–34.0)
MCHC: 33.1 g/dL (ref 32.0–36.0)
MCV: 86.4 fL (ref 80.0–100.0)
Platelets: 248 10*3/uL (ref 150–440)
RBC: 4.29 MIL/uL (ref 3.80–5.20)
RDW: 13.2 % (ref 11.5–14.5)
WBC: 7.3 10*3/uL (ref 3.6–11.0)

## 2015-03-16 LAB — TROPONIN I

## 2015-03-16 MED ORDER — DOXYCYCLINE HYCLATE 50 MG PO CAPS: 100.0000 mg | ORAL_CAPSULE | Freq: Two times a day (BID) | ORAL | Status: AC

## 2015-03-16 NOTE — ED Notes (Signed)
Patient to [facility] ED with C/O chest pain/tightness, generalized body aches, fatigue, headache with recent history of tick bite to navel.

## 2015-03-16 NOTE — ED Provider Notes (Signed)
[facility] Emergency Department Provider Note  ____________________________________________  Time seen: 1330  I have reviewed the triage vital signs and the nursing notes.   HISTORY  Chief Complaint Tick Removal; Insect Bite; Generalized Body Aches; Fatigue; Headache; and Chest Pain   History limited by: Not Limited   HPI Heather Zamora is a 49 y.o. female who presents to the emergency department because of concerns for a rash. Patient states the rash formed around a site of a tick bite. She states she got a tick removed by her primary care doctor 5 days ago. The tick was in her navel. She is unsure of how long the tick was there. 2 days after the tick removal she developed a red rash around her umbilicus. It was accompanied by itchiness. Additionally the patient has been having some body aches, fatigue and headache. She has not had any other rash. She denies any fevers.    Past Medical History  Diagnosis Date  . COPD (chronic obstructive pulmonary disease)   . Pulmonary hypertension     doctor gave okay for procedure today  . Cancer Right Lung    There are no active problems to display for this patient.   Past Surgical History  Procedure Laterality Date  . Cholecystectomy    . Cystectomy ovary    . Ovarian cyst removed      Current Outpatient Rx  Name  Route  Sig  Dispense  Refill  . ibuprofen (ADVIL,MOTRIN) 200 MG tablet   Oral   Take 1,000 mg by mouth every 6 (six) hours as needed. pain           Allergies Penicillins  History reviewed. No pertinent family history.  Social History History  Substance Use Topics  . Smoking status: Never Smoker   . Smokeless tobacco: Not on file  . Alcohol Use: No    Review of Systems  Constitutional: Negative for fever. Cardiovascular: Positive for chest pain. Respiratory: Negative for shortness of breath. Gastrointestinal: Negative for abdominal pain, vomiting and  diarrhea. Genitourinary: Negative for dysuria. Musculoskeletal: Body aches Skin: Rash around umbilicus Neurological: Positive for headaches  10-point ROS otherwise negative.  ____________________________________________   PHYSICAL EXAM:  VITAL SIGNS: ED Triage Vitals  Enc Vitals Group     BP 03/16/15 1120 130/77 mmHg     Pulse Rate 03/16/15 1120 66     Resp 03/16/15 1329 16     Temp 03/16/15 1120 98.3 F (36.8 C)     Temp Source 03/16/15 1120 Oral     SpO2 03/16/15 1120 100 %     Weight 03/16/15 1120 185 lb (83.915 kg)     Height 03/16/15 1120 5' (1.524 m)     Head Cir --      Peak Flow --      Pain Score 03/16/15 1127 7   Constitutional: Alert and oriented. Well appearing and in no distress. Eyes: Conjunctivae are normal. PERRL. Normal extraocular movements. ENT   Head: Normocephalic and atraumatic.   Nose: No congestion/rhinnorhea.   Mouth/Throat: Mucous membranes are moist.   Neck: No stridor. Hematological/Lymphatic/Immunilogical: No cervical lymphadenopathy. Cardiovascular: Normal rate, regular rhythm.  No murmurs, rubs, or gallops. Respiratory: Normal respiratory effort without tachypnea nor retractions. Breath sounds are clear and equal bilaterally. No wheezes/rales/rhonchi. Gastrointestinal: Soft and nontender. No distention.  Genitourinary: Deferred Musculoskeletal: Normal range of motion in all extremities. No joint effusions.  No lower extremity tenderness nor edema. Neurologic:  Normal speech and language. No gross  focal neurologic deficits are appreciated. Speech is normal. No gait instability. Skin:  Skin is warm, dry and intact. Erythema and warmth around the umbilicus roughly 4 cm in diameter. No fluctuant mass appreciated. No other rash appreciated. Psychiatric: Mood and affect are normal. Speech and behavior are normal. Patient exhibits appropriate insight and judgment.  ____________________________________________    LABS (pertinent  positives/negatives)  Labs Reviewed  CBC  BASIC METABOLIC PANEL  TROPONIN I      ____________________________________________   EKG  EKG Time: 1128 Rate: 65 Rhythm: Normal Sinus rhythm Axis: Normal Intervals: QTc 422 QRS: Normal ST changes: No ST elevation or depression     ____________________________________________    RADIOLOGY  None  ____________________________________________   PROCEDURES  Procedure(s) performed: None  Critical Care performed: No  ____________________________________________   INITIAL IMPRESSION / ASSESSMENT AND PLAN / ED COURSE  Pertinent labs & imaging results that were available during my care of the patient were reviewed by me and considered in my medical decision making (see chart for details).  Patient presents to the emergency department because of concerns for a rash around her umbilicus. Patient states that she did have a tick recently removed from that area. Patient also has myriad other complaints. Unclear if patient has a simple cellulitis versus tickborne illness. Rash is certainly not consistent with R RMSF and given lack of fever doubt RMSF. In terms of lyme not the classic [place]sey[place] appearance. We will however give a course of doxycycline given concern for cellulitis.  In my judgment, in view of the above findings, the patient has a reassuring evaluation and can be safely discharged.Issues concerning treatment and diagnosis were discussed. There were no barriers to understanding. The plan of treatment explanation was well received by the patient and/or family who then verbalized understanding.  ____________________________________________   FINAL CLINICAL IMPRESSION(S) / ED DIAGNOSES  Final diagnoses:  Cellulitis of abdominal wall  Insect bite     Nance Pear, MD 03/16/15 1353

## 2015-03-16 NOTE — Discharge Instructions (Signed)
Please seek medical attention for any high fevers, chest pain, shortness of breath, change in behavior, persistent vomiting, bloody stool or any other new or concerning symptoms.   Cellulitis Cellulitis is an infection of the skin and the tissue under the skin. The infected area is usually red and tender. This happens most often in the arms and lower legs. HOME CARE   Take your antibiotic medicine as told. Finish the medicine even if you start to feel better.  Keep the infected arm or leg raised (elevated).  Put a warm cloth on the area up to 4 times per day.  Only take medicines as told by your doctor.  Keep all doctor visits as told. GET HELP IF:  You see red streaks on the skin coming from the infected area.  Your red area gets bigger or turns a dark color.  Your bone or joint under the infected area is painful after the skin heals.  Your infection comes back in the same area or different area.  You have a puffy (swollen) bump in the infected area.  You have new symptoms.  You have a fever. GET HELP RIGHT AWAY IF:   You feel very sleepy.  You throw up (vomit) or have watery poop (diarrhea).  You feel sick and have muscle aches and pains. MAKE SURE YOU:   Understand these instructions.  Will watch your condition.  Will get help right away if you are not doing well or get worse. Document Released: 04/12/2008 Document Revised: 03/11/2014 Document Reviewed: 01/10/2012 [facility] Patient Information 2015 [place], [place]. This information is not intended to replace advice given to you by your health care provider. Make sure you discuss any questions you have with your health care provider.

## 2015-04-14 ENCOUNTER — Ambulatory Visit: Payer: Medicaid Other

## 2015-04-14 ENCOUNTER — Ambulatory Visit: Payer: Medicaid Other | Attending: Oncology

## 2016-01-30 ENCOUNTER — Other Ambulatory Visit: Payer: Self-pay | Admitting: *Deleted

## 2016-01-30 ENCOUNTER — Ambulatory Visit
Admission: RE | Admit: 2016-01-30 | Discharge: 2016-01-30 | Disposition: A | Discharge status: Home / Self Care | Payer: Self-pay | Source: Ambulatory Visit | Attending: Oncology | Admitting: Oncology

## 2016-01-30 ENCOUNTER — Inpatient Hospital Stay: Payer: Self-pay | Attending: Oncology

## 2016-01-30 DIAGNOSIS — I272 Other secondary pulmonary hypertension: Secondary | ICD-10-CM | POA: Insufficient documentation

## 2016-01-30 DIAGNOSIS — F418 Other specified anxiety disorders: Secondary | ICD-10-CM | POA: Insufficient documentation

## 2016-01-30 DIAGNOSIS — C3490 Malignant neoplasm of unspecified part of unspecified bronchus or lung: Secondary | ICD-10-CM

## 2016-01-30 DIAGNOSIS — Z87891 Personal history of nicotine dependence: Secondary | ICD-10-CM | POA: Insufficient documentation

## 2016-01-30 DIAGNOSIS — R42 Dizziness and giddiness: Secondary | ICD-10-CM | POA: Insufficient documentation

## 2016-01-30 DIAGNOSIS — I38 Endocarditis, valve unspecified: Secondary | ICD-10-CM | POA: Insufficient documentation

## 2016-01-30 DIAGNOSIS — J449 Chronic obstructive pulmonary disease, unspecified: Secondary | ICD-10-CM | POA: Insufficient documentation

## 2016-01-30 DIAGNOSIS — E669 Obesity, unspecified: Secondary | ICD-10-CM | POA: Insufficient documentation

## 2016-01-30 DIAGNOSIS — J984 Other disorders of lung: Secondary | ICD-10-CM | POA: Insufficient documentation

## 2016-01-30 DIAGNOSIS — M549 Dorsalgia, unspecified: Secondary | ICD-10-CM | POA: Insufficient documentation

## 2016-01-30 DIAGNOSIS — Z85118 Personal history of other malignant neoplasm of bronchus and lung: Secondary | ICD-10-CM | POA: Insufficient documentation

## 2016-01-30 DIAGNOSIS — M4806 Spinal stenosis, lumbar region: Secondary | ICD-10-CM | POA: Insufficient documentation

## 2016-01-30 DIAGNOSIS — G8929 Other chronic pain: Secondary | ICD-10-CM | POA: Insufficient documentation

## 2016-01-30 LAB — COMPREHENSIVE METABOLIC PANEL
ALT: 28 U/L (ref 14–54)
ANION GAP: 6 (ref 5–15)
AST: 27 U/L (ref 15–41)
Albumin: 4.3 g/dL (ref 3.5–5.0)
Alkaline Phosphatase: 103 U/L (ref 38–126)
BUN: 15 mg/dL (ref 6–20)
CO2: 26 mmol/L (ref 22–32)
Calcium: 9 mg/dL (ref 8.9–10.3)
Chloride: 105 mmol/L (ref 101–111)
Creatinine, Ser: 0.89 mg/dL (ref 0.44–1.00)
GFR calc non Af Amer: >60 mL/min (ref >60)
Glucose, Bld: 96 mg/dL (ref 65–99)
POTASSIUM: 3.9 mmol/L (ref 3.5–5.1)
SODIUM: 137 mmol/L (ref 135–145)
TOTAL PROTEIN: 7.8 g/dL (ref 6.5–8.1)
Total Bilirubin: 1 mg/dL (ref 0.3–1.2)

## 2016-01-30 LAB — CBC WITH DIFFERENTIAL/PLATELET
BASOS PCT: 1 %
Basophils Absolute: 0.1 10*3/uL (ref 0–0.1)
EOS ABS: 0.1 10*3/uL (ref 0–0.7)
EOS PCT: 1 %
HCT: 36.5 % (ref 35.0–47.0)
HEMOGLOBIN: 12.4 g/dL (ref 12.0–16.0)
LYMPHS ABS: 2.6 10*3/uL (ref 1.0–3.6)
Lymphocytes Relative: 34 %
MCH: 29 pg (ref 26.0–34.0)
MCHC: 34.1 g/dL (ref 32.0–36.0)
MCV: 85 fL (ref 80.0–100.0)
MONO ABS: 0.4 10*3/uL (ref 0.2–0.9)
MONOS PCT: 6 %
NEUTROS PCT: 58 %
Neutro Abs: 4.4 10*3/uL (ref 1.4–6.5)
Platelets: 242 10*3/uL (ref 150–440)
RBC: 4.3 MIL/uL (ref 3.80–5.20)
RDW: 13.3 % (ref 11.5–14.5)
WBC: 7.5 10*3/uL (ref 3.6–11.0)

## 2016-02-02 ENCOUNTER — Inpatient Hospital Stay (HOSPITAL_BASED_OUTPATIENT_CLINIC_OR_DEPARTMENT_OTHER): Payer: Self-pay | Admitting: Oncology

## 2016-02-02 ENCOUNTER — Encounter: Payer: Self-pay | Admitting: Oncology

## 2016-02-02 VITALS — BP 117/76 | HR 76 | Temp 97.2°F | Resp 18 | Wt 178.0 lb

## 2016-02-02 DIAGNOSIS — Z85118 Personal history of other malignant neoplasm of bronchus and lung: Secondary | ICD-10-CM

## 2016-02-02 DIAGNOSIS — M4806 Spinal stenosis, lumbar region: Secondary | ICD-10-CM

## 2016-02-02 DIAGNOSIS — C7A8 Other malignant neuroendocrine tumors: Secondary | ICD-10-CM

## 2016-02-02 DIAGNOSIS — F418 Other specified anxiety disorders: Secondary | ICD-10-CM

## 2016-02-02 DIAGNOSIS — J449 Chronic obstructive pulmonary disease, unspecified: Secondary | ICD-10-CM

## 2016-02-02 DIAGNOSIS — R911 Solitary pulmonary nodule: Secondary | ICD-10-CM

## 2016-02-02 DIAGNOSIS — Z87891 Personal history of nicotine dependence: Secondary | ICD-10-CM

## 2016-02-02 DIAGNOSIS — M549 Dorsalgia, unspecified: Secondary | ICD-10-CM

## 2016-02-02 DIAGNOSIS — R42 Dizziness and giddiness: Secondary | ICD-10-CM

## 2016-02-02 DIAGNOSIS — G8929 Other chronic pain: Secondary | ICD-10-CM

## 2016-02-02 DIAGNOSIS — I272 Other secondary pulmonary hypertension: Secondary | ICD-10-CM

## 2016-02-02 DIAGNOSIS — E669 Obesity, unspecified: Secondary | ICD-10-CM

## 2016-02-02 DIAGNOSIS — I38 Endocarditis, valve unspecified: Secondary | ICD-10-CM

## 2016-02-02 NOTE — Progress Notes (Signed)
Patient here today for CT results. 

## 2016-02-02 NOTE — Progress Notes (Signed)
[place] @ [facility] Telephone:([phone]) [phone]  Fax:(336) [place]: 10-Mar-1966  MR#: [id number]  NFA#:[id number]  Patient Care Team: Marguerita Merles, MD as PCP - General (Family Medicine)  CHIEF COMPLAINT:  Chief Complaint  Patient presents with  . Results     No history exists.   Subjective: Chief Complaint/Diagnosis:   1. CT scan of the chest dated Mar 21, 2012 so small nodule in the right lower lobe appears to be slightly increased in size from prior CT scan.  Indeterminate 3 mm nodule in the right lung apex.  2. 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  INTERVAL HISTORY: Patient is here for her evaluation regarding no neuroendocrine tumor status post resection.  Patient does not have any diarrhea.  No hot flashes.  No chills.  No abdominal pain.  Patient has chronic back pain which has been evaluated in the past.  Here for further follow-up and treatment consideration Mr. her mammogram last year and this year REVIEW OF SYSTEMS:   GENERAL:  Feels good.  Active.  No fevers, sweats or weight loss. PERFORMANCE STATUS (ECOG):  0 HEENT:  No visual changes, runny nose, sore throat, mouth sores or tenderness. Lungs: No shortness of breath or cough.  No hemoptysis. Cardiac:  No chest pain, palpitations, orthopnea, or PND. GI:  No nausea, vomiting, diarrhea, constipation, melena or hematochezia. GU:  No urgency, frequency, dysuria, or hematuria. Musculoskeletal:  PATIENT HAS  chronic back pain Extremities:  No pain or swelling. Skin:  No rashes or skin changes. Neuro:  No headache, numbness or weakness, balance or coordination issues. Endocrine:  No diabetes, thyroid issues, hot flashes or night sweats. Psych:  No mood changes, depression or anxiety. Pain:  No focal pain. Review of systems:  All other systems reviewed and found to be negative. As per HPI. Otherwise, a  complete review of systems is negatve.  PAST MEDICAL HISTORY: Past Medical History  Diagnosis Date  . COPD (chronic obstructive pulmonary disease) ([place])   . Pulmonary hypertension [facility])     doctor gave okay for procedure today  . Cancer ([place]) Right Lung    PAST SURGICAL HISTORY: Past Surgical History  Procedure Laterality Date  . Cholecystectomy    . Cystectomy ovary    . Ovarian cyst removed      Significant History/PMH:   COPD:    leaky valve:    Vertigo:    pulmonary hypertension:    Hemorrhoids:    dizziness:    rt thorocotomy:    ovarian cyst removal: 1989   Cholecystectomy: 2000  Preventive Screening:  Has patient had any of the following test? Mammography (1)   Last Mammography: March 2014(1)   Smoking History: Smoking History Quit May 2013(1)  PFSH: Comments: No family history of colorectal cancer, breast cancer, or ovarian cancer.  Comments: chronic smoker.  Does not drink  Additional Past Medical and Surgical History: Moderate obesity   FAMILY HISTORY No family history on file.  ADVANCED DIRECTIVES:  No flowsheet data found.  HEALTH MAINTENANCE: Social History  Substance Use Topics  . Smoking status: Never Smoker   . Smokeless tobacco: Not on file  . Alcohol Use: No      Allergies  Allergen Reactions  . Penicillins Hives    Current Outpatient Prescriptions  Medication Sig Dispense Refill  . ibuprofen (ADVIL,MOTRIN) 200 MG tablet Take 1,000 mg by mouth every 6 (  six) hours as needed. pain     No current facility-administered medications for this visit.    OBJECTIVE:  Filed Vitals:   02/02/16 0959  BP: 117/76  Pulse: 76  Temp: 97.2 F (36.2 C)  Resp: 18     Body mass index is 34.76 kg/(m^2).    ECOG FS:0 - Asymptomatic  PHYSICAL EXAM:  GENERAL:  Well developed, well nourished, sitting comfortably in the exam room in no acute distress.  Moderately obese lady MENTAL STATUS:  Alert and oriented to person, place and  time.  ENT:  Oropharynx clear without lesion.  Tongue normal. Mucous membranes moist.  RESPIRATORY:  Clear to auscultation without rales, wheezes or rhonchi. CARDIOVASCULAR:  Regular rate and rhythm without murmur, rub or gallop. . ABDOMEN:  Soft, non-tender, with active bowel sounds, and no hepatosplenomegaly.  No masses. BACK:  No CVA tenderness.  No tenderness on percussion of the back or rib cage. SKIN:  No rashes, ulcers or lesions. EXTREMITIES: No edema, no skin discoloration or tenderness.  No palpable cords. LYMPH NODES: No palpable cervical, supraclavicular, axillary or inguinal adenopathy  NEUROLOGICAL: Unremarkable. PSYCH:  Appropriate.   LAB RESULTS:  CBC Latest Ref Rng 01/30/2016 03/16/2015  WBC 3.6 - 11.0 K/uL 7.5 7.3  Hemoglobin 12.0 - 16.0 g/dL 12.4 12.3  Hematocrit 35.0 - 47.0 % 36.5 37.1  Platelets 150 - 440 K/uL 242 248    Appointment on 01/30/2016  Component Date Value Ref Range Status  . WBC 01/30/2016 7.5  3.6 - 11.0 K/uL Final  . RBC 01/30/2016 4.30  3.80 - 5.20 MIL/uL Final  . Hemoglobin 01/30/2016 12.4  12.0 - 16.0 g/dL Final  . HCT 01/30/2016 36.5  35.0 - 47.0 % Final  . MCV 01/30/2016 85.0  80.0 - 100.0 fL Final  . MCH 01/30/2016 29.0  26.0 - 34.0 pg Final  . MCHC 01/30/2016 34.1  32.0 - 36.0 g/dL Final  . RDW 01/30/2016 13.3  11.5 - 14.5 % Final  . Platelets 01/30/2016 242  150 - 440 K/uL Final  . Neutrophils Relative % 01/30/2016 58   Final  . Neutro Abs 01/30/2016 4.4  1.4 - 6.5 K/uL Final  . Lymphocytes Relative 01/30/2016 34   Final  . Lymphs Abs 01/30/2016 2.6  1.0 - 3.6 K/uL Final  . Monocytes Relative 01/30/2016 6   Final  . Monocytes Absolute 01/30/2016 0.4  0.2 - 0.9 K/uL Final  . Eosinophils Relative 01/30/2016 1   Final  . Eosinophils Absolute 01/30/2016 0.1  0 - 0.7 K/uL Final  . Basophils Relative 01/30/2016 1   Final  . Basophils Absolute 01/30/2016 0.1  0 - 0.1 K/uL Final  . Sodium 01/30/2016 137  135 - 145 mmol/L Final  .  Potassium 01/30/2016 3.9  3.5 - 5.1 mmol/L Final  . Chloride 01/30/2016 105  101 - 111 mmol/L Final  . CO2 01/30/2016 26  22 - 32 mmol/L Final  . Glucose, Bld 01/30/2016 96  65 - 99 mg/dL Final  . BUN 01/30/2016 15  6 - 20 mg/dL Final  . Creatinine, Ser 01/30/2016 0.89  0.44 - 1.00 mg/dL Final  . Calcium 01/30/2016 9.0  8.9 - 10.3 mg/dL Final  . Total Protein 01/30/2016 7.8  6.5 - 8.1 g/dL Final  . Albumin 01/30/2016 4.3  3.5 - 5.0 g/dL Final  . AST 01/30/2016 27  15 - 41 U/L Final  . ALT 01/30/2016 28  14 - 54 U/L Final  . Alkaline Phosphatase 01/30/2016 103  38 - 126 U/L Final  . Total Bilirubin 01/30/2016 1.0  0.3 - 1.2 mg/dL Final  . GFR calc non Af Amer 01/30/2016 >60  >60 mL/min Final  . GFR calc Af Amer 01/30/2016 >60  >60 mL/min Final   Comment: (NOTE) The eGFR has been calculated using the CKD EPI equation. This calculation has not been validated in all clinical situations. eGFR's persistently <60 mL/min signify possible Chronic Kidney Disease.   . Anion gap 01/30/2016 6  5 - 15 Final       STUDIES: Ct Chest Wo Contrast  01/30/2016  CLINICAL DATA:  Followup right lung carcinoma. Previous right lower lobe wedge resection. Former smoker. EXAM: CT CHEST WITHOUT CONTRAST TECHNIQUE: Multidetector CT imaging of the chest was performed following the standard protocol without IV contrast. COMPARISON:  01/28/2015 FINDINGS: Mediastinum/Lymph Nodes: No masses or pathologically enlarged lymph nodes identified on this un-enhanced exam. Heart size is normal. Lungs/Pleura: Right lung scarring remains stable. Mild emphysema again noted. No suspicious pulmonary nodules or masses identified. No evidence of pulmonary infiltrate or pleural effusion. No pulmonary mass, infiltrate, or effusion. Upper abdomen: Normal adrenal glands. Surgical clips from prior cholecystectomy. Musculoskeletal: No chest wall mass or suspicious bone lesions identified. IMPRESSION: Stable right lung scarring. No active  disease. No evidence of recurrent or metastatic carcinoma within the thorax. Electronically Signed   By: Earle Gell M.D.   On: 01/30/2016 16:21    ASSESSMENT: Impression:   1. Right lower lobe neuroendocrine tumor.  2. Chronic back pain.  3. Anxiety and depression.  Plan:   1. Right lower lobe neuroendocrine tumor. Post resection in Sept. 2013. Continues with chronic cough, shortness of breath, and weakness.  repeat . [place] Kitchen 2. Chronic back pain. MRI of lumbar spine revealed stenosis and degenerative disc diseas  MEDICAL DECISION MAKING:  CT scan of the chest has been reviewed there is no evidence of recurrent or progressing disease Repeat CT scan which has been reviewed independently in March of 2004, 2017 was reported to be negative for any recurrent or progressive disease Screening mammogram has been scheduled  She will be reevaluated in 1 year with repeat CT scan and my associate.  She was informed about my retirement plan end choices of her oncologist Total duration of visit was 25 minutes.  50% or more time was spent in counseling patient and family regarding prognosis and options of treatment and available resources Patient expressed understanding and was in agreement with this plan. She also understands that She can call clinic at any time with any questions, concerns, or complaints.    No matching staging information was found for the patient.  Forest Gleason, MD   02/02/2016 11:00 AM

## 2016-02-03 ENCOUNTER — Ambulatory Visit: Payer: Medicaid Other | Admitting: Oncology

## 2016-02-13 ENCOUNTER — Ambulatory Visit: Payer: Self-pay

## 2016-02-17 ENCOUNTER — Ambulatory Visit
Admission: RE | Admit: 2016-02-17 | Discharge: 2016-02-17 | Disposition: A | Discharge status: Home / Self Care | Payer: Self-pay | Source: Ambulatory Visit | Attending: Oncology | Admitting: Oncology

## 2016-02-17 DIAGNOSIS — R911 Solitary pulmonary nodule: Secondary | ICD-10-CM

## 2016-02-17 DIAGNOSIS — Z1231 Encounter for screening mammogram for malignant neoplasm of breast: Secondary | ICD-10-CM | POA: Insufficient documentation

## 2016-02-17 DIAGNOSIS — C7A8 Other malignant neuroendocrine tumors: Secondary | ICD-10-CM

## 2016-02-17 DIAGNOSIS — R921 Mammographic calcification found on diagnostic imaging of breast: Secondary | ICD-10-CM | POA: Insufficient documentation

## 2016-02-18 ENCOUNTER — Other Ambulatory Visit: Payer: Self-pay | Admitting: *Deleted

## 2016-02-18 DIAGNOSIS — R920 Mammographic microcalcification found on diagnostic imaging of breast: Secondary | ICD-10-CM

## 2016-02-24 ENCOUNTER — Ambulatory Visit
Admission: RE | Admit: 2016-02-24 | Discharge: 2016-02-24 | Disposition: A | Discharge status: Home / Self Care | Payer: Self-pay | Source: Ambulatory Visit | Attending: Oncology | Admitting: Oncology

## 2016-02-24 ENCOUNTER — Other Ambulatory Visit: Payer: Self-pay | Admitting: *Deleted

## 2016-02-24 DIAGNOSIS — R920 Mammographic microcalcification found on diagnostic imaging of breast: Secondary | ICD-10-CM

## 2016-03-01 ENCOUNTER — Ambulatory Visit: Payer: Self-pay | Attending: Oncology | Admitting: *Deleted

## 2016-03-01 ENCOUNTER — Encounter: Payer: Self-pay | Admitting: *Deleted

## 2016-03-01 VITALS — BP 133/76 | HR 79 | Temp 98.6°F | Ht 62.21 in | Wt 178.0 lb

## 2016-03-01 DIAGNOSIS — R920 Mammographic microcalcification found on diagnostic imaging of breast: Secondary | ICD-10-CM

## 2016-03-01 NOTE — Patient Instructions (Signed)
Gave patient hand-out, Women Staying Healthy, Active and Well from [facility], with education on breast health, pap smears, heart and colon health. 

## 2016-03-01 NOTE — Progress Notes (Signed)
Subjective:     Patient ID: Heather Zamora, female   DOB: 1966/05/07, 50 y.o.   MRN: [id number]  HPI   Review of Systems     Objective:   Physical Exam  Pulmonary/Chest: Right breast exhibits no inverted nipple, no mass, no nipple discharge, no skin change and no tenderness. Left breast exhibits no inverted nipple, no mass, no nipple discharge, no skin change and no tenderness. Breasts are symmetrical.       Assessment:     50 year old White female presents to [facility] for further financial assistance.  Patient had a birads 4 mammogram through our [facility] on 02/24/16.  Patient with history of lung cancer diagnosed 2013 per patient.  States she had a lobectomy at that time.  No further treatment was needed.  She is currently in follow-up with Dr. Oliva Bustard.  Clinical breast exam is unremarkable.   Patient has been screened for eligibility.  She does not have any insurance, Medicare or Medicaid.  She also meets financial eligibility.  Hand-out given on the Affordable Care Act.     Plan:     Patient has been scheduled for a steriotactic biopsy on Thursday for calcifications through either our [facility] program or [employer].  Will follow-up per [facility] protocol.

## 2016-03-04 ENCOUNTER — Ambulatory Visit
Admission: RE | Admit: 2016-03-04 | Discharge: 2016-03-04 | Disposition: A | Discharge status: Home / Self Care | Payer: Self-pay | Source: Ambulatory Visit | Attending: Oncology | Admitting: Oncology

## 2016-03-04 ENCOUNTER — Other Ambulatory Visit: Payer: Self-pay | Admitting: *Deleted

## 2016-03-04 DIAGNOSIS — R920 Mammographic microcalcification found on diagnostic imaging of breast: Secondary | ICD-10-CM

## 2016-03-04 HISTORY — PX: BREAST BIOPSY: SHX20

## 2016-03-05 LAB — SURGICAL PATHOLOGY

## 2016-03-08 ENCOUNTER — Encounter: Payer: Self-pay | Admitting: *Deleted

## 2016-03-08 NOTE — Progress Notes (Signed)
Talked to patient today and informed of her benign path results.  Informed that she is to follow-up in one year with annual screening unless otherwise indicated by the radiologist.  Informed I would notify her if anything was different.  HSIS to [place].

## 2016-03-12 ENCOUNTER — Ambulatory Visit
Admission: RE | Admit: 2016-03-12 | Discharge: 2016-03-12 | Disposition: A | Discharge status: Home / Self Care | Payer: Self-pay | Source: Ambulatory Visit | Attending: Oncology | Admitting: Oncology

## 2016-03-12 ENCOUNTER — Other Ambulatory Visit: Payer: Self-pay | Admitting: *Deleted

## 2016-03-12 DIAGNOSIS — N644 Mastodynia: Secondary | ICD-10-CM

## 2016-03-12 NOTE — Progress Notes (Signed)
Heather Zamora from the breast center called and stated the patient had called complaining of pain at the biopsy site.  Asked if I could put in an order for an ultrasound.  Order placed.  Possible hematoma.

## 2017-02-01 ENCOUNTER — Other Ambulatory Visit: Payer: Self-pay | Admitting: *Deleted

## 2017-02-01 ENCOUNTER — Ambulatory Visit
Admission: RE | Admit: 2017-02-01 | Discharge: 2017-02-01 | Disposition: A | Discharge status: Home / Self Care | Payer: PRIVATE HEALTH INSURANCE | Source: Ambulatory Visit | Attending: Oncology | Admitting: Oncology

## 2017-02-01 DIAGNOSIS — R911 Solitary pulmonary nodule: Secondary | ICD-10-CM

## 2017-02-01 DIAGNOSIS — C7A00 Malignant carcinoid tumor of unspecified site: Secondary | ICD-10-CM | POA: Diagnosis not present

## 2017-02-01 DIAGNOSIS — C7A8 Other malignant neuroendocrine tumors: Secondary | ICD-10-CM

## 2017-02-02 ENCOUNTER — Other Ambulatory Visit: Payer: Self-pay | Admitting: Nurse Practitioner

## 2017-02-02 DIAGNOSIS — Z1231 Encounter for screening mammogram for malignant neoplasm of breast: Secondary | ICD-10-CM

## 2017-02-03 ENCOUNTER — Inpatient Hospital Stay: Payer: PRIVATE HEALTH INSURANCE | Attending: Hematology and Oncology

## 2017-02-03 ENCOUNTER — Inpatient Hospital Stay (HOSPITAL_BASED_OUTPATIENT_CLINIC_OR_DEPARTMENT_OTHER): Payer: PRIVATE HEALTH INSURANCE | Admitting: Hematology and Oncology

## 2017-02-03 VITALS — BP 108/72 | HR 62 | Temp 97.0°F | Resp 18 | Wt 189.5 lb

## 2017-02-03 DIAGNOSIS — Z87891 Personal history of nicotine dependence: Secondary | ICD-10-CM

## 2017-02-03 DIAGNOSIS — Z86010 Personal history of colonic polyps: Secondary | ICD-10-CM

## 2017-02-03 DIAGNOSIS — D3A090 Benign carcinoid tumor of the bronchus and lung: Secondary | ICD-10-CM

## 2017-02-03 DIAGNOSIS — C7A090 Malignant carcinoid tumor of the bronchus and lung: Secondary | ICD-10-CM

## 2017-02-03 DIAGNOSIS — I272 Pulmonary hypertension, unspecified: Secondary | ICD-10-CM

## 2017-02-03 DIAGNOSIS — N62 Hypertrophy of breast: Secondary | ICD-10-CM

## 2017-02-03 DIAGNOSIS — R079 Chest pain, unspecified: Secondary | ICD-10-CM | POA: Insufficient documentation

## 2017-02-03 DIAGNOSIS — Z9049 Acquired absence of other specified parts of digestive tract: Secondary | ICD-10-CM | POA: Diagnosis not present

## 2017-02-03 DIAGNOSIS — Z803 Family history of malignant neoplasm of breast: Secondary | ICD-10-CM | POA: Insufficient documentation

## 2017-02-03 DIAGNOSIS — J449 Chronic obstructive pulmonary disease, unspecified: Secondary | ICD-10-CM | POA: Diagnosis not present

## 2017-02-03 LAB — COMPREHENSIVE METABOLIC PANEL
ALT: 17 U/L (ref 14–54)
AST: 20 U/L (ref 15–41)
Albumin: 4.2 g/dL (ref 3.5–5.0)
Alkaline Phosphatase: 71 U/L (ref 38–126)
Anion gap: 6 (ref 5–15)
BUN: 17 mg/dL (ref 6–20)
CO2: 23 mmol/L (ref 22–32)
Calcium: 9.4 mg/dL (ref 8.9–10.3)
Chloride: 107 mmol/L (ref 101–111)
Creatinine, Ser: 0.74 mg/dL (ref 0.44–1.00)
GFR calc Af Amer: >60 mL/min (ref >60)
GFR calc non Af Amer: >60 mL/min (ref >60)
Glucose, Bld: 94 mg/dL (ref 65–99)
Potassium: 4.2 mmol/L (ref 3.5–5.1)
Sodium: 136 mmol/L (ref 135–145)
Total Bilirubin: 0.8 mg/dL (ref 0.3–1.2)
Total Protein: 7.7 g/dL (ref 6.5–8.1)

## 2017-02-03 LAB — CBC WITH DIFFERENTIAL/PLATELET
Basophils Absolute: 0.1 10*3/uL (ref 0–0.1)
Basophils Relative: 1 %
Eosinophils Absolute: 0.1 10*3/uL (ref 0–0.7)
Eosinophils Relative: 1 %
HCT: 37.9 % (ref 35.0–47.0)
Hemoglobin: 13.1 g/dL (ref 12.0–16.0)
Lymphocytes Relative: 38 %
Lymphs Abs: 2.4 10*3/uL (ref 1.0–3.6)
MCH: 29.5 pg (ref 26.0–34.0)
MCHC: 34.5 g/dL (ref 32.0–36.0)
MCV: 85.5 fL (ref 80.0–100.0)
Monocytes Absolute: 0.4 10*3/uL (ref 0.2–0.9)
Monocytes Relative: 7 %
Neutro Abs: 3.4 10*3/uL (ref 1.4–6.5)
Neutrophils Relative %: 53 %
Platelets: 245 10*3/uL (ref 150–440)
RBC: 4.43 MIL/uL (ref 3.80–5.20)
RDW: 13.3 % (ref 11.5–14.5)
WBC: 6.4 10*3/uL (ref 3.6–11.0)

## 2017-02-03 NOTE — Progress Notes (Signed)
Patient states for past 3 months she has had some mid chest pain that radiates into her back.  States sometimes it wakes her up.  She does feel SOB when she is having the pain.  Also states she is having pain in left flank area that radiates into her side for past several weeks.

## 2017-02-03 NOTE — Progress Notes (Signed)
[facility] day:  02/03/2017  Chief Complaint: Heather Zamora is a 51 y.o. female with stage IA pulmonary carcinoid who is seen for reassessment.  HPI: The patient initially presented with chest pain.  As part of her work-up, she underwent chest imaging.  Chest CT on 03/21/2012 revealed a 9 mm nodule in the right lower lobe slightly increased in size from the prior study.  PET scan on 03/29/2012 revealed an indeterminant vague area of hypermetabolic activity within a nodule at the base of the right lower lobe.  CT-guided core biopsy on 06/19/2012 revealed a low grade neuroendocrine tumor.  She underwent right thoracotomy with right lower lobe wedge resection on 08/02/2012 by Dr. Nestor Lewandowsky.  Pathology revealed a 9 mm typical carcinoid tumor.  Margins were clear.  Pathologic stage was T1aNx.  The patient was last seen in the medical oncology clinic on 01/13/2016 by Dr. Oliva Bustard. At that time, she was doing well. Chest CT on 01/30/2016 revealed stable right lung scarring with no active disease.  Bilateral screenng mammogram on 02/17/2016 revealed In the right breast, calcifications warrant further evaluation with magnified views. In the left breast, there were no findings suspicious for malignancy.  Diagnostic right mammogram on 02/24/2016 revealed a 14 x 9 x 5 mm group of indeterminate calcifications in the posterior aspect of the upper-outer quadrant of the right breast.   She underwent stereotactic biopsy on 03/04/2016.  Pathology revealed columnar cell change with associated calcifications. There was pseudo-angiomatous stromal hyperplasia. There was no atypia or malignancy.  She underwent right breast and axillary ultrasound on 03/12/2016 for pain and swelling of the right breast.  There were expected biopsy site changes.  Chest CT on 02/01/2017 revealed some interval evolution of peripheral right lung scar or development of associated  atelectasis.  Symptomatically, she notes some chest discomfort which she associated with her initial diagnosis.  Pain is in the center of her chest and radiates to her back.  She denies any reflux symptoms.  Last colonoscopy was 3 years ago.  She had polyps.  She states that it is time for a pelvic exam.   Past Medical History:  Diagnosis Date  . Cancer [facility]) Right Lung   2013  . COPD (chronic obstructive pulmonary disease) ([place])   . Pulmonary hypertension    doctor gave okay for procedure today    Past Surgical History:  Procedure Laterality Date  . BREAST BIOPSY Right 03/04/2016   stereo  . CHOLECYSTECTOMY    . cystectomy ovary    . ovarian cyst removed      Family History  Problem Relation Age of Onset  . Breast cancer Maternal Aunt     40's    Social History:  reports that she has never smoked. She does not have any smokeless tobacco history on file. She reports that she does not drink alcohol or use drugs.  She quit smoking in 03/2012.  She denies any exposure ot radiation or toxins.  She does not work.  She lives in [place].  The patient is alone today.  Allergies:  Allergies  Allergen Reactions  . Penicillins Hives    Current Medications: No current outpatient prescriptions on file.   No current facility-administered medications for this visit.     Review of Systems:  GENERAL:  Feels good.  No fevers, sweats or weight loss. PERFORMANCE STATUS (ECOG):  0 HEENT:  No visual changes, runny nose, sore throat, mouth sores or tenderness.  Lungs: No shortness of breath or cough.  No hemoptysis. Cardiac:  Chest pain (see HPI).  No palpitations, orthopnea, or PND. GI:  No nausea, vomiting, diarrhea, constipation, melena or hematochezia. GU:  No urgency, frequency, dysuria, or hematuria. Musculoskeletal:  No back pain.  No joint pain.  No muscle tenderness. Extremities:  No pain or swelling. Skin:  No rashes or skin changes. Neuro:  No headache, numbness or  weakness, balance or coordination issues. Endocrine:  No diabetes, thyroid issues, hot flashes or night sweats. Psych:  No mood changes, depression or anxiety. Pain:  No focal pain. Review of systems:  All other systems reviewed and found to be negative.  Physical Exam: Blood pressure 108/72, pulse 62, temperature 97 F (36.1 C), temperature source Tympanic, resp. rate 18, weight 189 lb 8 oz (86 kg). GENERAL:  Well developed, well nourished, woman sitting comfortably in the exam room in no acute distress. MENTAL STATUS:  Alert and oriented to person, place and time. HEAD:  Pearline Cables styled hair.  Normocephalic, atraumatic, face symmetric, no Cushingoid features. EYES:  Blue eyes.  Pupils equal round and reactive to light and accomodation.  No conjunctivitis or scleral icterus. ENT:  Oropharynx clear without lesion.  Tongue normal. Mucous membranes moist.  RESPIRATORY:  Clear to auscultation without rales, wheezes or rhonchi. CARDIOVASCULAR:  Regular rate and rhythm without murmur, rub or gallop. ABDOMEN:  Soft, non-tender, with active bowel sounds, and no hepatosplenomegaly.  No masses. SKIN:  No rashes, ulcers or lesions. EXTREMITIES: No edema, no skin discoloration or tenderness.  No palpable cords. LYMPH NODES: No palpable cervical, supraclavicular, axillary or inguinal adenopathy  NEUROLOGICAL: Unremarkable. PSYCH:  Appropriate.   Appointment on 02/03/2017  Component Date Value Ref Range Status  . WBC 02/03/2017 6.4  3.6 - 11.0 K/uL Final  . RBC 02/03/2017 4.43  3.80 - 5.20 MIL/uL Final  . Hemoglobin 02/03/2017 13.1  12.0 - 16.0 g/dL Final  . HCT 02/03/2017 37.9  35.0 - 47.0 % Final  . MCV 02/03/2017 85.5  80.0 - 100.0 fL Final  . MCH 02/03/2017 29.5  26.0 - 34.0 pg Final  . MCHC 02/03/2017 34.5  32.0 - 36.0 g/dL Final  . RDW 02/03/2017 13.3  11.5 - 14.5 % Final  . Platelets 02/03/2017 245  150 - 440 K/uL Final  . Neutrophils Relative % 02/03/2017 53  % Final  . Neutro Abs  02/03/2017 3.4  1.4 - 6.5 K/uL Final  . Lymphocytes Relative 02/03/2017 38  % Final  . Lymphs Abs 02/03/2017 2.4  1.0 - 3.6 K/uL Final  . Monocytes Relative 02/03/2017 7  % Final  . Monocytes Absolute 02/03/2017 0.4  0.2 - 0.9 K/uL Final  . Eosinophils Relative 02/03/2017 1  % Final  . Eosinophils Absolute 02/03/2017 0.1  0 - 0.7 K/uL Final  . Basophils Relative 02/03/2017 1  % Final  . Basophils Absolute 02/03/2017 0.1  0 - 0.1 K/uL Final  . Sodium 02/03/2017 136  135 - 145 mmol/L Final  . Potassium 02/03/2017 4.2  3.5 - 5.1 mmol/L Final  . Chloride 02/03/2017 107  101 - 111 mmol/L Final  . CO2 02/03/2017 23  22 - 32 mmol/L Final  . Glucose, Bld 02/03/2017 94  65 - 99 mg/dL Final  . BUN 02/03/2017 17  6 - 20 mg/dL Final  . Creatinine, Ser 02/03/2017 0.74  0.44 - 1.00 mg/dL Final  . Calcium 02/03/2017 9.4  8.9 - 10.3 mg/dL Final  . Total Protein 02/03/2017 7.7  6.5 - 8.1 g/dL Final  . Albumin 02/03/2017 4.2  3.5 - 5.0 g/dL Final  . AST 02/03/2017 20  15 - 41 U/L Final  . ALT 02/03/2017 17  14 - 54 U/L Final  . Alkaline Phosphatase 02/03/2017 71  38 - 126 U/L Final  . Total Bilirubin 02/03/2017 0.8  0.3 - 1.2 mg/dL Final  . GFR calc non Af Amer 02/03/2017 >60  >60 mL/min Final  . GFR calc Af Amer 02/03/2017 >60  >60 mL/min Final   Comment: (NOTE) The eGFR has been calculated using the CKD EPI equation. This calculation has not been validated in all clinical situations. eGFR's persistently <60 mL/min signify possible Chronic Kidney Disease.   Georgiann Hahn gap 02/03/2017 6  5 - 15 Final    Assessment:  SEIDY LABRECK is a 51 y.o. 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  Pathology revealed a 9 mm typical carcinoid tumor.  Margins were clear.  Pathologic stage was T1aNx.  Chest CT on 02/01/2017 revealed some interval evolution of peripheral right lung scar or development of associated atelectasis.  Bilateral screenng mammogram on  02/17/2016 revealed In the right breast, calcifications warrant further evaluation with magnified views. In the left breast, there were no findings suspicious for malignancy.  Diagnostic right mammogram on 02/24/2016 revealed a 14 x 9 x 5 mm group of indeterminate calcifications in the posterior aspect of the upper-outer quadrant of the right breast.  Stereotactic biopsy on 03/04/2016 revealed no atypia or malignancy.  Last colonoscopy was 3 years ago.  She had polyps.  She is due for a pelvic exam.  Symptomatically, she notes some chest discomfort.  Pain is in the center of her chest and radiates to her back.  She denies any reflux symptoms.  Exam is unremarkable.  Plan: 1.  Discuss entire medical history, diagnosis and management of pulmonary carcinoid, recent mammograms and biopsy.  Discuss recent chest CT.  No evidence of recurrence. 2.  Discuss consideration of the low dose chest CT program given her history of smoking.  Patient interested. 3.  Labs today:  CBC with diff, CMP. 4.  Contact Burgess Estelle, RN, regarding low dose chest CT program. 5.  Patient to follow-up with PCP regarding periodic chest pain.  Encourage immediate medical evaluation if pain severe and/or does not resolve quickly. 6.  RTC in 1 year for MD assessment, labs (CBC with diff, CMP), review of chest CT or low dose chest CT (if she qualifies)   Lequita Asal, MD  02/03/2017, 11:10 AM

## 2017-02-05 ENCOUNTER — Encounter: Payer: Self-pay | Admitting: Hematology and Oncology

## 2017-03-02 ENCOUNTER — Ambulatory Visit
Admission: RE | Admit: 2017-03-02 | Discharge: 2017-03-02 | Disposition: A | Discharge status: Home / Self Care | Payer: PRIVATE HEALTH INSURANCE | Source: Ambulatory Visit | Attending: Nurse Practitioner | Admitting: Nurse Practitioner

## 2017-03-02 DIAGNOSIS — Z1231 Encounter for screening mammogram for malignant neoplasm of breast: Secondary | ICD-10-CM | POA: Insufficient documentation

## 2017-03-20 ENCOUNTER — Encounter: Payer: Self-pay | Admitting: *Deleted

## 2017-03-20 ENCOUNTER — Emergency Department
Admission: EM | Admit: 2017-03-20 | Discharge: 2017-03-20 | Disposition: A | Discharge status: Home / Self Care | Payer: PRIVATE HEALTH INSURANCE | Attending: Emergency Medicine | Admitting: Emergency Medicine

## 2017-03-20 ENCOUNTER — Emergency Department: Payer: PRIVATE HEALTH INSURANCE

## 2017-03-20 DIAGNOSIS — M542 Cervicalgia: Secondary | ICD-10-CM | POA: Diagnosis present

## 2017-03-20 DIAGNOSIS — M5030 Other cervical disc degeneration, unspecified cervical region: Secondary | ICD-10-CM | POA: Diagnosis not present

## 2017-03-20 DIAGNOSIS — J449 Chronic obstructive pulmonary disease, unspecified: Secondary | ICD-10-CM | POA: Diagnosis not present

## 2017-03-20 DIAGNOSIS — Z85118 Personal history of other malignant neoplasm of bronchus and lung: Secondary | ICD-10-CM | POA: Insufficient documentation

## 2017-03-20 MED ORDER — ETODOLAC 400 MG PO TABS
400.0000 mg | ORAL_TABLET | Freq: Two times a day (BID) | ORAL | 0 refills | Status: DC
Start: 2017-03-20 — End: 2018-01-31

## 2017-03-20 MED ORDER — METHOCARBAMOL 500 MG PO TABS: ORAL_TABLET | ORAL | 0 refills | Status: DC

## 2017-03-20 NOTE — ED Notes (Signed)
See triage note  States she developed pain to left side of neck about 3 days ago  Denies any injury states she felt a pop before pain started  Has tried OTC meds w/o relief

## 2017-03-20 NOTE — ED Triage Notes (Signed)
States 3 days ago she felt a "sensation" of a popping and burning on the left shoulder, states since then she has neck pain that goes down her back, states she feels as if it is pulling her left arm, denies any chest pain

## 2017-03-20 NOTE — Discharge Instructions (Signed)
Discontinue taking ibuprofen or any over-the-counter medications at this time. Begin taking etodolac 400 mg twice a day with food. Also Robaxin 500 mg 1 or 2 every 6 hours as needed for muscle spasms. Follow-up with your primary care doctor if any continued problems. Also try ice or heat to your neck as needed for comfort. Do not take muscle relaxant and drive.

## 2017-03-20 NOTE — ED Provider Notes (Signed)
[facility] Emergency Department Provider Note  ____________________________________________   First MD Initiated Contact with Patient 03/20/17 1227     (approximate)  I have reviewed the triage vital signs and the nursing notes.   HISTORY  Chief Complaint Neck Pain    HPI Heather Zamora is a 51 y.o. female is here with complaint of neck pain. Patient states that she began having pain in her neck approximately 3 days ago and felt a "popping sensation". Since that time she has experienced burning sensation into her left shoulder. She denies any previous injury to her neck. She states that although she has some discomfort and a pulling sensation in her left arm she is not having any chest pain or shortness of breath. She denies any cardiac history. Pain is increased with range of motion of her neck. Patient has been taking over-the-counter medication without any relief. Patient drove herself to the emergency department today. Currently she rates her pain as a 10 over 10.   Past Medical History:  Diagnosis Date  . Cancer [facility]) Right Lung   2013  . COPD (chronic obstructive pulmonary disease) ([place])   . Pulmonary hypertension [facility])    doctor gave okay for procedure today    Patient Active Problem List   Diagnosis Date Noted  . 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 BREAST BIOPSY Right 03/04/2016   benign  . CHOLECYSTECTOMY    . cystectomy ovary    . ovarian cyst removed      Prior to Admission medications   Medication Sig Start Date End Date Taking? Authorizing Provider  etodolac (LODINE) 400 MG tablet Take 1 tablet (400 mg total) by mouth 2 (two) times daily. 03/20/17   Johnn Hai, PA-C  methocarbamol (ROBAXIN) 500 MG tablet 1-2 every 6 hours prn muscle spasms 03/20/17   Johnn Hai, PA-C    Allergies Penicillins  Family History  Problem Relation Age of Onset  .  Breast cancer Maternal Aunt        40's    Social History Social History  Substance Use Topics  . Smoking status: Never Smoker  . Smokeless tobacco: Never Used  . Alcohol use No    Review of Systems Constitutional: No fever/chills Eyes: No visual changes. ENT: No sore throat. Denies ear pain. Cardiovascular: Denies chest pain. Respiratory: Denies shortness of breath. Gastrointestinal:  No nausea, no vomiting.   Musculoskeletal: Positive for cervical pain. Positive for left arm radiculopathy. Skin: Negative for rash. Neurological: Negative for headaches, focal weakness or numbness.   ____________________________________________   PHYSICAL EXAM:  VITAL SIGNS: ED Triage Vitals  Enc Vitals Group     BP 03/20/17 1214 125/66     Pulse Rate 03/20/17 1214 86     Resp 03/20/17 1214 16     Temp 03/20/17 1214 98.6 F (37 C)     Temp Source 03/20/17 1214 Oral     SpO2 03/20/17 1214 98 %     Weight 03/20/17 1209 176 lb (79.8 kg)     Height 03/20/17 1209 5' (1.524 m)     Head Circumference --      Peak Flow --      Pain Score 03/20/17 1208 10     Pain Loc --      Pain Edu? --      Excl. in [place]? --     Constitutional: Alert and oriented. Well appearing  and in no acute distress. Eyes: Conjunctivae are normal. PERRL. EOMI. Head: Atraumatic. Nose: No congestion/rhinnorhea. Mouth/Throat: Mucous membranes are moist.  Oropharynx non-erythematous. Neck: No stridor.  Nontender cervical spine to point tenderness. Range of motion is markedly reduced secondary to discomfort. Patient is able to move to the right approximately 10 without pain. She is unable to flex or extend her neck and movement to the left is painful. There is marked tenderness on palpation of the left trapezius muscle. Nontender rhomboid muscle on the left. Hematological/Lymphatic/Immunilogical: No cervical lymphadenopathy. Cardiovascular: Normal rate, regular rhythm. Grossly normal heart sounds.  Good peripheral  circulation. Respiratory: Normal respiratory effort.  No retractions. Lungs CTAB. Gastrointestinal: Soft and nontender. No distention.  Musculoskeletal: Examination of left shoulder there is no gross deformity noted. Range of motion is able in all 4 planes with passive movement. No crepitus was noted. Neurologic:  Normal speech and language. No gross focal neurologic deficits are appreciated. No gait instability. Skin:  Skin is warm, dry and intact. No ecchymosis, abrasions, erythema was noted. No soft tissue swelling present. Psychiatric: Mood and affect are normal. Speech and behavior are normal.  ____________________________________________   LABS (all labs ordered are listed, but only abnormal results are displayed)  Labs Reviewed - No data to display  RADIOLOGY  Cervical spine x-ray per radiologist shows degenerative changes but no acute bony abnormality. I, Johnn Hai, personally viewed and evaluated these images (plain radiographs) as part of my medical decision making, as well as reviewing the written report by the radiologist.  EKG:    NSR with ventricular rate of 89. PR interval 172, QRS duration 70, Reviewed by Dr. Reita Cliche. ____________________________________________   PROCEDURES  Procedure(s) performed: None  Procedures  Critical Care performed: No  ____________________________________________   INITIAL IMPRESSION / ASSESSMENT AND PLAN / ED COURSE  Pertinent labs & imaging results that were available during my care of the patient were reviewed by me and considered in my medical decision making (see chart for details).  Patient is lying and drove herself to the emergency room today. They're unable to give her any muscle relaxants while she was here. Patient understood that this medication can cause drowsiness. She was given a prescription for Robaxin 500 mg one or 2 tablets every 6 hours as needed for muscle spasms. She is also to begin taking etodolac 400 mg  twice a day with food. She will discontinue take ibuprofen as he is not been helping her in the past. She is encouraged to use ice or heat to her neck as needed for comfort and to follow-up with her PCP if any continued problems.    ___________________________________________   FINAL CLINICAL IMPRESSION(S) / ED DIAGNOSES  Final diagnoses:  Degenerative disc disease, cervical  Neck pain      NEW MEDICATIONS STARTED DURING THIS VISIT:  Discharge Medication List as of 03/20/2017  1:48 PM    START taking these medications   Details  etodolac (LODINE) 400 MG tablet Take 1 tablet (400 mg total) by mouth 2 (two) times daily., Starting Sun 03/20/2017, Print    methocarbamol (ROBAXIN) 500 MG tablet 1-2 every 6 hours prn muscle spasms, Print         Note:  This document was prepared using Dragon voice recognition software and may include unintentional dictation errors.    Johnn Hai, PA-C 03/20/17 1634    Lisa Roca, MD 03/23/17 [phone]

## 2017-04-28 ENCOUNTER — Encounter: Payer: Self-pay | Admitting: Emergency Medicine

## 2017-04-28 ENCOUNTER — Emergency Department: Payer: PRIVATE HEALTH INSURANCE

## 2017-04-28 ENCOUNTER — Emergency Department
Admission: EM | Admit: 2017-04-28 | Discharge: 2017-04-28 | Disposition: A | Discharge status: Home / Self Care | Payer: PRIVATE HEALTH INSURANCE | Attending: Emergency Medicine | Admitting: Emergency Medicine

## 2017-04-28 DIAGNOSIS — M545 Low back pain: Secondary | ICD-10-CM | POA: Diagnosis not present

## 2017-04-28 DIAGNOSIS — J449 Chronic obstructive pulmonary disease, unspecified: Secondary | ICD-10-CM | POA: Insufficient documentation

## 2017-04-28 DIAGNOSIS — R0602 Shortness of breath: Secondary | ICD-10-CM | POA: Insufficient documentation

## 2017-04-28 DIAGNOSIS — Z9049 Acquired absence of other specified parts of digestive tract: Secondary | ICD-10-CM | POA: Insufficient documentation

## 2017-04-28 DIAGNOSIS — Z85118 Personal history of other malignant neoplasm of bronchus and lung: Secondary | ICD-10-CM | POA: Diagnosis not present

## 2017-04-28 DIAGNOSIS — M546 Pain in thoracic spine: Secondary | ICD-10-CM

## 2017-04-28 DIAGNOSIS — R002 Palpitations: Secondary | ICD-10-CM | POA: Insufficient documentation

## 2017-04-28 DIAGNOSIS — Z79899 Other long term (current) drug therapy: Secondary | ICD-10-CM | POA: Insufficient documentation

## 2017-04-28 LAB — BASIC METABOLIC PANEL
ANION GAP: 5 (ref 5–15)
BUN: 15 mg/dL (ref 6–20)
CO2: 27 mmol/L (ref 22–32)
Calcium: 9.3 mg/dL (ref 8.9–10.3)
Chloride: 107 mmol/L (ref 101–111)
Creatinine, Ser: 1 mg/dL (ref 0.44–1.00)
Glucose, Bld: 85 mg/dL (ref 65–99)
Potassium: 3.8 mmol/L (ref 3.5–5.1)
SODIUM: 139 mmol/L (ref 135–145)

## 2017-04-28 LAB — CBC
HEMATOCRIT: 37.3 % (ref 35.0–47.0)
HEMOGLOBIN: 12.7 g/dL (ref 12.0–16.0)
MCH: 29.4 pg (ref 26.0–34.0)
MCHC: 34 g/dL (ref 32.0–36.0)
MCV: 86.3 fL (ref 80.0–100.0)
Platelets: 257 10*3/uL (ref 150–440)
RBC: 4.32 MIL/uL (ref 3.80–5.20)
RDW: 13.2 % (ref 11.5–14.5)
WBC: 6.8 10*3/uL (ref 3.6–11.0)

## 2017-04-28 LAB — TROPONIN I

## 2017-04-28 NOTE — ED Provider Notes (Signed)
[facility] Emergency Department Provider Note  ____________________________________________   First MD Initiated Contact with Patient 04/28/17 1323     (approximate)  I have reviewed the triage vital signs and the nursing notes.   HISTORY  Chief Complaint Palpitations; Shoulder pain; and Lightheadedness   HPI Heather Zamora is a 51 y.o. female with a history of COPD as well as pulmonary hypertension who is presenting to the emergency department today complaining of palpitations and waking up in the middle the night gasping for air. The patient says that she has beenexperiencing left-sided thoracic back pain over the past week which she describes a mild to moderate burning. Denies any radiation of this pain. Her husband is at the bedside and says the patient snores loudly at night and sometimes will hold her breath. The patient says that she's had several weeks of ongoing gasping for air that awakens her when sleeping similar last night. However, she said that the episodes last night was worse than normal and this is what brought her to the hospital. Patient denies any shortness of breath , palpitations or chest pain at this time.   Past Medical History:  Diagnosis Date  . Cancer [facility]) Right Lung   2013  . COPD (chronic obstructive pulmonary disease) ([place])   . Pulmonary hypertension [facility])    doctor gave okay for procedure today    Patient Active Problem List   Diagnosis Date Noted  . 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 BREAST BIOPSY Right 03/04/2016   benign  . CHOLECYSTECTOMY    . cystectomy ovary    . ovarian cyst removed      Prior to Admission medications   Medication Sig Start Date End Date Taking? Authorizing Provider  etodolac (LODINE) 400 MG tablet Take 1 tablet (400 mg total) by mouth 2 (two) times daily. 03/20/17   Johnn Hai, PA-C  methocarbamol (ROBAXIN) 500 MG  tablet 1-2 every 6 hours prn muscle spasms 03/20/17   Johnn Hai, PA-C    Allergies Penicillins  Family History  Problem Relation Age of Onset  . Breast cancer Maternal Aunt        40's    Social History Social History  Substance Use Topics  . Smoking status: Never Smoker  . Smokeless tobacco: Never Used  . Alcohol use No    Review of Systems  Constitutional: No fever/chills Eyes: No visual changes. ENT: No sore throat. Cardiovascular: Denies chest pain. Respiratory: as above Gastrointestinal: No abdominal pain.  No nausea, no vomiting.  No diarrhea.  No constipation. Genitourinary: Negative for dysuria. Musculoskeletal: Negative for back pain. Skin: Negative for rash. Neurological: Negative for headaches, focal weakness or numbness.   ____________________________________________   PHYSICAL EXAM:  VITAL SIGNS: ED Triage Vitals  Enc Vitals Group     BP 04/28/17 1051 (!) 111/59     Pulse Rate 04/28/17 1051 78     Resp --      Temp 04/28/17 1051 98 F (36.7 C)     Temp Source 04/28/17 1051 Oral     SpO2 04/28/17 1051 99 %     Weight 04/28/17 1051 181 lb (82.1 kg)     Height 04/28/17 1051 5' (1.524 m)     Head Circumference --      Peak Flow --      Pain Score 04/28/17 1057 10     Pain Loc --  Pain Edu? --      Excl. in [place]? --     Constitutional: Alert and oriented. Well appearing and in no acute distress. Eyes: Conjunctivae are normal.  Head: Atraumatic. Nose: No congestion/rhinnorhea. Mouth/Throat: Mucous membranes are moist.  Neck: No stridor.   Cardiovascular: Normal rate, regular rhythm. Grossly normal heart sounds.   Respiratory: Normal respiratory effort.  No retractions. Lungs CTAB. Gastrointestinal: Soft and nontender. No distention. Musculoskeletal: No lower extremity tenderness nor edema.  No joint effusions. Neurologic:  Normal speech and language. No gross focal neurologic deficits are appreciated. Skin:  Skin is warm, dry and  intact. No rash noted. Psychiatric: Mood and affect are normal. Speech and behavior are normal.  ____________________________________________   LABS (all labs ordered are listed, but only abnormal results are displayed)  Labs Reviewed  BASIC METABOLIC PANEL  CBC  TROPONIN I   ____________________________________________  EKG  ED ECG REPORT I, Saya Mccoll,  Youlanda Roys, the attending physician, personally viewed and interpreted this ECG.   Date: 04/28/2017  EKG Time: 1102  Rate: 75  Rhythm: normal sinus rhythm  Axis: normal  Intervals:none  ST&T Change: No ST segment elevation or depression. No abnormal T-wave inversion.  ____________________________________________  RADIOLOGY  No acute finding on the chest x-ray. ____________________________________________   PROCEDURES  Procedure(s) performed:   Procedures  Critical Care performed:   ____________________________________________   INITIAL IMPRESSION / ASSESSMENT AND PLAN / ED COURSE  Pertinent labs & imaging results that were available during my care of the patient were reviewed by me and considered in my medical decision making (see chart for details).  Patient with redundant tissue around her neck. Suspecting sleep apnea as the cause for her complaints. Also without any tenderness palpation of the left thoracic region. No rash visualized. Patient does have a history of degenerative disease and this may be a manifestation of that. We discussed using icy hot over the area and she is understanding one to comply. She'll be final. The primary care doctor further evaluation of sleep apnea. I feel that all the symptoms may be explained by sleep apnea. Patient also said that she was feeling very anxious before going to bed last night which may also be contributing. Very reassuring workup in terms of cardiovascular disease. Negative troponin after at least 1 week of symptoms and EKG reassuring as well.       ____________________________________________   FINAL CLINICAL IMPRESSION(S) / ED DIAGNOSES  Palpitations. Shortness of breath. Back pain.    NEW MEDICATIONS STARTED DURING THIS VISIT:  New Prescriptions   No medications on file     Note:  This document was prepared using Dragon voice recognition software and may include unintentional dictation errors.     Orbie Pyo, MD 04/28/17 [phone]

## 2017-04-28 NOTE — ED Triage Notes (Signed)
Pt brought over from [facility]. Pt reports palpitations and lightheadedness that woke her up in the middle of the night. Pt reports left shoulder pain. Pt denies nausea and vomiting. Pt reports headache and dizziness upon standing.

## 2017-12-04 ENCOUNTER — Encounter: Payer: Self-pay | Admitting: Emergency Medicine

## 2017-12-04 ENCOUNTER — Emergency Department: Payer: PRIVATE HEALTH INSURANCE

## 2017-12-04 ENCOUNTER — Other Ambulatory Visit: Payer: Self-pay

## 2017-12-04 ENCOUNTER — Emergency Department
Admission: EM | Admit: 2017-12-04 | Discharge: 2017-12-04 | Disposition: A | Discharge status: Home / Self Care | Payer: PRIVATE HEALTH INSURANCE | Attending: Emergency Medicine | Admitting: Emergency Medicine

## 2017-12-04 DIAGNOSIS — R079 Chest pain, unspecified: Secondary | ICD-10-CM | POA: Diagnosis present

## 2017-12-04 DIAGNOSIS — J449 Chronic obstructive pulmonary disease, unspecified: Secondary | ICD-10-CM | POA: Insufficient documentation

## 2017-12-04 DIAGNOSIS — Z880 Allergy status to penicillin: Secondary | ICD-10-CM | POA: Insufficient documentation

## 2017-12-04 DIAGNOSIS — R0602 Shortness of breath: Secondary | ICD-10-CM | POA: Insufficient documentation

## 2017-12-04 DIAGNOSIS — Z85118 Personal history of other malignant neoplasm of bronchus and lung: Secondary | ICD-10-CM | POA: Insufficient documentation

## 2017-12-04 LAB — COMPREHENSIVE METABOLIC PANEL
ALT: 26 U/L (ref 14–54)
AST: 28 U/L (ref 15–41)
Albumin: 4.2 g/dL (ref 3.5–5.0)
Alkaline Phosphatase: 75 U/L (ref 38–126)
Anion gap: 8 (ref 5–15)
BUN: 14 mg/dL (ref 6–20)
CHLORIDE: 107 mmol/L (ref 101–111)
CO2: 23 mmol/L (ref 22–32)
CREATININE: 0.84 mg/dL (ref 0.44–1.00)
Calcium: 9.1 mg/dL (ref 8.9–10.3)
Glucose, Bld: 101 mg/dL — ABNORMAL HIGH (ref 65–99)
POTASSIUM: 3.8 mmol/L (ref 3.5–5.1)
Sodium: 138 mmol/L (ref 135–145)
Total Bilirubin: 1.1 mg/dL (ref 0.3–1.2)
Total Protein: 7 g/dL (ref 6.5–8.1)

## 2017-12-04 LAB — CBC WITH DIFFERENTIAL/PLATELET
Basophils Absolute: 0.1 10*3/uL (ref 0–0.1)
Basophils Relative: 2 %
EOS PCT: 1 %
Eosinophils Absolute: 0.1 10*3/uL (ref 0–0.7)
HCT: 37.5 % (ref 35.0–47.0)
Hemoglobin: 12.7 g/dL (ref 12.0–16.0)
LYMPHS PCT: 46 %
Lymphs Abs: 3.3 10*3/uL (ref 1.0–3.6)
MCH: 29.5 pg (ref 26.0–34.0)
MCHC: 33.8 g/dL (ref 32.0–36.0)
MCV: 87.1 fL (ref 80.0–100.0)
MONO ABS: 0.5 10*3/uL (ref 0.2–0.9)
Monocytes Relative: 7 %
Neutro Abs: 3.1 10*3/uL (ref 1.4–6.5)
Neutrophils Relative %: 44 %
PLATELETS: 339 10*3/uL (ref 150–440)
RBC: 4.3 MIL/uL (ref 3.80–5.20)
RDW: 13.6 % (ref 11.5–14.5)
WBC: 7.1 10*3/uL (ref 3.6–11.0)

## 2017-12-04 LAB — TROPONIN I

## 2017-12-04 MED ORDER — IOPAMIDOL (ISOVUE-370) INJECTION 76%
75.0000 mL | Freq: Once | INTRAVENOUS | Status: AC | PRN
  Administered 2017-12-04: 75 mL via INTRAVENOUS

## 2017-12-04 NOTE — Discharge Instructions (Signed)
Please seek medical attention for any high fevers, chest pain, shortness of breath, change in behavior, persistent vomiting, bloody stool or any other new or concerning symptoms.  

## 2017-12-04 NOTE — ED Triage Notes (Signed)
Patient to ED via [facility]. REports she was driving and had a sudden pressure in her chest radiating to right side. Patient denies history of heart disease. Reports history of cancer on right lung. Patient denies SOB. Reports diaphoresis during episode. Given 324 mg of ASA and 1 SL nitro by EMS. Alert and oriented x4.

## 2017-12-04 NOTE — ED Notes (Signed)
Patient verbalized understanding of discharge instructions and follow-up care. Ambulatory to lobby with steady gait and NAD.

## 2017-12-04 NOTE — ED Provider Notes (Signed)
[facility] Emergency Department Provider Note   ____________________________________________   First MD Initiated Contact with Patient 12/04/17 1252     (approximate)  I have reviewed the triage vital signs and the nursing notes.   HISTORY  Chief Complaint Chest Pain    HPI Heather Zamora is a 52 y.o. female Patient reports she was driving her car and had sudden sharp pain in the center of her chest radiating to the right followed by heavy pressure feeling like someone sitting on her with some shortness of breath shortness of breath is more the pressure feeling she told meand clamminess. She pulled over EMS gave her 324 aspirin and 1 sublingual nitroglycerin.with no change.   Past Medical History:  Diagnosis Date  . Cancer [facility]) Right Lung   2013  . COPD (chronic obstructive pulmonary disease) ([place])   . Pulmonary hypertension [facility])    doctor gave okay for procedure today    Patient Active Problem List   Diagnosis Date Noted  . 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 BREAST BIOPSY Right 03/04/2016   benign  . CHOLECYSTECTOMY    . cystectomy ovary    . ovarian cyst removed      Prior to Admission medications   Medication Sig Start Date End Date Taking? Authorizing Provider  etodolac (LODINE) 400 MG tablet Take 1 tablet (400 mg total) by mouth 2 (two) times daily. 03/20/17   Johnn Hai, PA-C  methocarbamol (ROBAXIN) 500 MG tablet 1-2 every 6 hours prn muscle spasms 03/20/17   Johnn Hai, PA-C    Allergies Penicillins and Tape  Family History  Problem Relation Age of Onset  . Breast cancer Maternal Aunt        40's    Social History Social History   Tobacco Use  . Smoking status: Never Smoker  . Smokeless tobacco: Never Used  Substance Use Topics  . Alcohol use: No  . Drug use: No    Review of Systems  Constitutional: No fever/chills Eyes: No visual  changes. ENT: No sore throat. Cardiovascular: see history of present illness. Respiratory: see history of present illness Gastrointestinal: No abdominal pain.   nausea, no vomiting.  No diarrhea.  No constipation. Genitourinary: Negative for dysuria. Musculoskeletal: Negative for back pain. Skin: Negative for rash. Neurological: Negative for headaches, focal weakness  ____________________________________________   PHYSICAL EXAM:  VITAL SIGNS: ED Triage Vitals  Enc Vitals Group     BP      Pulse      Resp      Temp      Temp src      SpO2      Weight      Height      Head Circumference      Peak Flow      Pain Score      Pain Loc      Pain Edu?      Excl. in [place]?     Constitutional: Alert and oriented. Well appearing and in no acute distress. Eyes: Conjunctivae are normal.  Head: Atraumatic. Nose: No congestion/rhinnorhea. Mouth/Throat: Mucous membranes are moist.  Oropharynx non-erythematous. Neck: No stridor.   Cardiovascular: Normal rate, regular rhythm. Grossly normal heart sounds.  Good peripheral circulation. Respiratory: Normal respiratory effort.  No retractions. Lungs CTAB. Gastrointestinal: Soft and nontender. No distention. No abdominal bruits. No CVA tenderness. Musculoskeletal: No lower extremity tenderness nor edema.  No joint effusions. Neurologic:  Normal speech and language. No gross focal neurologic deficits are appreciated. Skin:  Skin is warm, dry and intact. No rash noted. Psychiatric: Mood and affect are normal. Speech and behavior are normal.  ____________________________________________   LABS (all labs ordered are listed, but only abnormal results are displayed)  Labs Reviewed  COMPREHENSIVE METABOLIC PANEL - Abnormal; Notable for the following components:      Result Value   Glucose, Bld 101 (*)    All other components within normal limits  CBC WITH DIFFERENTIAL/PLATELET  TROPONIN I  TROPONIN I    ____________________________________________  EKG  EKG read and interpreted by me shows normal sinus rhythm rate of 67 normal axis no acute ST-T wave changes and looks similar to an EKG from ____________________________________________  RADIOLOGY  chest x-ray read as no acute disease  ____________________________________________   PROCEDURES  Procedure(s) performed:   Procedures  Critical Care performed:  ____________________________________________   INITIAL IMPRESSION / ASSESSMENT AND PLAN / ED COURSE   Dr. Archie Balboa will check a second troponin and CT scan        ____________________________________________   FINAL CLINICAL IMPRESSION(S) / ED DIAGNOSES  Final diagnoses:  Chest pain, unspecified type     ED Discharge Orders    None       Note:  This document was prepared using Dragon voice recognition software and may include unintentional dictation errors.    Nena Polio, MD 12/04/17 1505

## 2017-12-04 NOTE — ED Provider Notes (Signed)
Second troponin negative. CT angio without PE or PNA or other obvious acute etiology of the patient's pain. Patient states she is feeling better. Discussed findings with patient. Discussed return precautions and encouraged PCP follow up.   Nance Pear, MD 12/04/17 [phone]

## 2017-12-29 ENCOUNTER — Ambulatory Visit: Payer: PRIVATE HEALTH INSURANCE | Attending: Neurology

## 2017-12-29 DIAGNOSIS — G4761 Periodic limb movement disorder: Secondary | ICD-10-CM | POA: Insufficient documentation

## 2017-12-29 DIAGNOSIS — R0683 Snoring: Secondary | ICD-10-CM | POA: Diagnosis present

## 2018-01-09 DIAGNOSIS — M5030 Other cervical disc degeneration, unspecified cervical region: Secondary | ICD-10-CM | POA: Insufficient documentation

## 2018-01-09 DIAGNOSIS — R768 Other specified abnormal immunological findings in serum: Secondary | ICD-10-CM | POA: Insufficient documentation

## 2018-01-09 DIAGNOSIS — M2550 Pain in unspecified joint: Secondary | ICD-10-CM | POA: Insufficient documentation

## 2018-01-23 ENCOUNTER — Other Ambulatory Visit: Payer: Self-pay | Admitting: Family Medicine

## 2018-01-23 DIAGNOSIS — Z1231 Encounter for screening mammogram for malignant neoplasm of breast: Secondary | ICD-10-CM

## 2018-01-25 ENCOUNTER — Telehealth: Payer: Self-pay | Admitting: *Deleted

## 2018-01-25 NOTE — Telephone Encounter (Signed)
She had a CT angio of the chest in January in the ED. She DOES NOT need another scan prior to her RTC on 02/03/2018.

## 2018-01-25 NOTE — Telephone Encounter (Signed)
Patient informed. 

## 2018-01-25 NOTE — Telephone Encounter (Signed)
Patient called asking if she is supposed to have a CT scan prior to her next appointment. Please advise, Per Raquel Sarna, she is not a candidate for CT Screening until 11/19/18

## 2018-01-31 ENCOUNTER — Inpatient Hospital Stay (HOSPITAL_BASED_OUTPATIENT_CLINIC_OR_DEPARTMENT_OTHER): Payer: No Typology Code available for payment source | Admitting: Hematology and Oncology

## 2018-01-31 ENCOUNTER — Encounter: Payer: Self-pay | Admitting: Hematology and Oncology

## 2018-01-31 ENCOUNTER — Other Ambulatory Visit: Payer: Self-pay

## 2018-01-31 ENCOUNTER — Inpatient Hospital Stay: Payer: No Typology Code available for payment source | Attending: Hematology and Oncology

## 2018-01-31 VITALS — BP 119/77 | HR 74 | Temp 97.4°F | Resp 18 | Wt 183.4 lb

## 2018-01-31 DIAGNOSIS — C7A090 Malignant carcinoid tumor of the bronchus and lung: Secondary | ICD-10-CM | POA: Insufficient documentation

## 2018-01-31 DIAGNOSIS — D3A090 Benign carcinoid tumor of the bronchus and lung: Secondary | ICD-10-CM

## 2018-01-31 DIAGNOSIS — R079 Chest pain, unspecified: Secondary | ICD-10-CM | POA: Insufficient documentation

## 2018-01-31 LAB — CBC WITH DIFFERENTIAL/PLATELET
Basophils Absolute: 0.1 10*3/uL (ref 0–0.1)
Basophils Relative: 1 %
Eosinophils Absolute: 0.1 10*3/uL (ref 0–0.7)
Eosinophils Relative: 2 %
HCT: 36.4 % (ref 35.0–47.0)
Hemoglobin: 12.4 g/dL (ref 12.0–16.0)
Lymphocytes Relative: 29 %
Lymphs Abs: 1.9 10*3/uL (ref 1.0–3.6)
MCH: 29.9 pg (ref 26.0–34.0)
MCHC: 34 g/dL (ref 32.0–36.0)
MCV: 87.8 fL (ref 80.0–100.0)
Monocytes Absolute: 0.5 10*3/uL (ref 0.2–0.9)
Monocytes Relative: 7 %
Neutro Abs: 4 10*3/uL (ref 1.4–6.5)
Neutrophils Relative %: 61 %
Platelets: 252 10*3/uL (ref 150–440)
RBC: 4.15 MIL/uL (ref 3.80–5.20)
RDW: 12.7 % (ref 11.5–14.5)
WBC: 6.6 10*3/uL (ref 3.6–11.0)

## 2018-01-31 LAB — COMPREHENSIVE METABOLIC PANEL
ALT: 19 U/L (ref 14–54)
AST: 23 U/L (ref 15–41)
Albumin: 4.1 g/dL (ref 3.5–5.0)
Alkaline Phosphatase: 93 U/L (ref 38–126)
Anion gap: 8 (ref 5–15)
BUN: 17 mg/dL (ref 6–20)
CO2: 26 mmol/L (ref 22–32)
Calcium: 9.4 mg/dL (ref 8.9–10.3)
Chloride: 106 mmol/L (ref 101–111)
Creatinine, Ser: 0.79 mg/dL (ref 0.44–1.00)
GFR calc Af Amer: >60 mL/min (ref >60)
GFR calc non Af Amer: >60 mL/min (ref >60)
Glucose, Bld: 100 mg/dL — ABNORMAL HIGH (ref 65–99)
Potassium: 4.6 mmol/L (ref 3.5–5.1)
Sodium: 140 mmol/L (ref 135–145)
Total Bilirubin: 0.9 mg/dL (ref 0.3–1.2)
Total Protein: 7.5 g/dL (ref 6.5–8.1)

## 2018-01-31 NOTE — Progress Notes (Signed)
Here for follow up. Stated overall doing well- stated has had episode of chest pain- went to ER -all findings neg (2 mo ago )  Primary care stated no need for cardiologist at this time.

## 2018-01-31 NOTE — Progress Notes (Signed)
[facility] day:  01/31/2018  Chief Complaint: Heather Zamora is a 52 y.o. female with stage IA pulmonary carcinoid who is seen for 1 year assessment.  HPI: The patient was last seen in the medical oncology clinic on 02/03/2017 for initial assessment.  She had a history of stage IA pulmonary carcinoid s/p right lower lobe wedge resection on 08/02/2012.  Chest CT on 02/01/2017 revealed no evidence of recurrent disease.  She noted some chest discomfort in the center of her chest that radiated to her back.  She denied any reflux symptoms.  Exam was unremarkable.  She was seen in the [facility] on 12/04/2017 with chest pain.  She notes that she was driving and "passed out". Chest CT angiogram revealed no evidence of pulmonary embolism.  There was peripheral scarring in RIGHT lung with interval decrease in size of a small nodular focus at the lateral base of the RIGHT middle lobe.  EKG and troponins were negative.  During the interim, patient has been doing well overall. She notes that she recently found out that she has arthritis. Patient has frequent episodes of right sided chest pain. She notes that she has had these episodes for "a long time". Patient states, "sometimes I am afraid to go to sleep". Patient advised by her PCP that there is no need to seek cardiology consult.   Patient denies increased shortness of breath. Patient denies B symptoms or interval symptoms. Patient is eating well. Her weight is down 6 pounds.  Patient complains of pain rated 7/10 today.    Past Medical History:  Diagnosis Date  . Cancer [facility]) Right Lung   2013  . COPD (chronic obstructive pulmonary disease) ([place])   . Pulmonary hypertension [facility])    doctor gave okay for procedure today    Past Surgical History:  Procedure Laterality Date  . BREAST BIOPSY Right 03/04/2016   benign  . CHOLECYSTECTOMY    . cystectomy ovary    . ovarian cyst removed      Family  History  Problem Relation Age of Onset  . Breast cancer Maternal Aunt        40's    Social History:  reports that she quit smoking about 5 years ago. Her smoking use included cigarettes. She has a 64.00 pack-year smoking history. She has never used smokeless tobacco. She reports that she does not drink alcohol or use drugs.  She started smoking at age 22.  She quit smoking in 03/2012.  She denies any exposure ot radiation or toxins.  She does not work.  She lives in [place].  The patient is alone today.  Allergies:  Allergies  Allergen Reactions  . Penicillins Hives  . Tape Rash    Current Medications: Current Outpatient Medications  Medication Sig Dispense Refill  . cyclobenzaprine (FLEXERIL) 5 MG tablet Take 2 tablet (10 MG) at bedtime as needed.  May cause drowsiness so it is recommended to only take this at night.    . Multiple Vitamin (MULTI-VITAMINS) TABS Take by mouth every morning.     . naproxen sodium (ALEVE) 220 MG tablet Take 220 mg by mouth 2 (two) times daily as needed.     . pantoprazole (PROTONIX) 40 MG tablet Take 40 mg by mouth daily.      No current facility-administered medications for this visit.     Review of Systems:  GENERAL:  Feels good.  No fevers or sweats.  Weight down 6  pounds. PERFORMANCE STATUS (ECOG):  1 HEENT:  No visual changes, runny nose, sore throat, mouth sores or tenderness. Lungs: No shortness of breath or cough.  No hemoptysis. Cardiac:  Chest pain.  No palpitations, orthopnea, or PND. GI:  No nausea, vomiting, diarrhea, constipation, melena or hematochezia. GU:  No urgency, frequency, dysuria, or hematuria. Musculoskeletal:  No back pain.  Arthritis pain.  No muscle tenderness. Extremities:  No pain or swelling. Skin:  No rashes or skin changes. Neuro:  No headache, numbness or weakness, balance or coordination issues. Endocrine:  No diabetes, thyroid issues, hot flashes or night sweats. Psych:  No mood changes, depression or  anxiety. Pain:  Pain 7 out of 10 (shoulder, elbow, neck). Review of systems:  All other systems reviewed and found to be negative.  Physical Exam: Blood pressure 119/77, pulse 74, temperature (!) 97.4 F (36.3 C), temperature source Tympanic, resp. rate 18, weight 183 lb 6.4 oz (83.2 kg), last menstrual period 03/02/2011. GENERAL:  Well developed, well nourished, woman sitting comfortably in the exam room in no acute distress. MENTAL STATUS:  Alert and oriented to person, place and time. HEAD:  Pearline Cables styled shoulder length hair.  Normocephalic, atraumatic, face symmetric, no Cushingoid features. EYES:  Blue eyes.  Pupils equal round and reactive to light and accomodation.  No conjunctivitis or scleral icterus. ENT:  Oropharynx clear without lesion.  Tongue normal. Mucous membranes moist.  RESPIRATORY:  Clear to auscultation without rales, wheezes or rhonchi. CARDIOVASCULAR:  Regular rate and rhythm without murmur, rub or gallop. ABDOMEN:  Soft, non-tender, with active bowel sounds, and no hepatosplenomegaly.  No masses. SKIN:  No rashes, ulcers or lesions. EXTREMITIES: No edema, no skin discoloration or tenderness.  No palpable cords. LYMPH NODES: No palpable cervical, supraclavicular, axillary or inguinal adenopathy  NEUROLOGICAL: Unremarkable. PSYCH:  Appropriate.   Appointment on 01/31/2018  Component Date Value Ref Range Status  . WBC 01/31/2018 6.6  3.6 - 11.0 K/uL Final  . RBC 01/31/2018 4.15  3.80 - 5.20 MIL/uL Final  . Hemoglobin 01/31/2018 12.4  12.0 - 16.0 g/dL Final  . HCT 01/31/2018 36.4  35.0 - 47.0 % Final  . MCV 01/31/2018 87.8  80.0 - 100.0 fL Final  . MCH 01/31/2018 29.9  26.0 - 34.0 pg Final  . MCHC 01/31/2018 34.0  32.0 - 36.0 g/dL Final  . RDW 01/31/2018 12.7  11.5 - 14.5 % Final  . Platelets 01/31/2018 252  150 - 440 K/uL Final  . Neutrophils Relative % 01/31/2018 61  % Final  . Neutro Abs 01/31/2018 4.0  1.4 - 6.5 K/uL Final  . Lymphocytes Relative 01/31/2018  29  % Final  . Lymphs Abs 01/31/2018 1.9  1.0 - 3.6 K/uL Final  . Monocytes Relative 01/31/2018 7  % Final  . Monocytes Absolute 01/31/2018 0.5  0.2 - 0.9 K/uL Final  . Eosinophils Relative 01/31/2018 2  % Final  . Eosinophils Absolute 01/31/2018 0.1  0 - 0.7 K/uL Final  . Basophils Relative 01/31/2018 1  % Final  . Basophils Absolute 01/31/2018 0.1  0 - 0.1 K/uL Final   Performed at [facility], [address] [place], [place] [address]  . Sodium 01/31/2018 140  135 - 145 mmol/L Final  . Potassium 01/31/2018 4.6  3.5 - 5.1 mmol/L Final  . Chloride 01/31/2018 106  101 - 111 mmol/L Final  . CO2 01/31/2018 26  22 - 32 mmol/L Final  . Glucose, Bld 01/31/2018 100* 65 - 99 mg/dL Final  .  BUN 01/31/2018 17  6 - 20 mg/dL Final  . Creatinine, Ser 01/31/2018 0.79  0.44 - 1.00 mg/dL Final  . Calcium 01/31/2018 9.4  8.9 - 10.3 mg/dL Final  . Total Protein 01/31/2018 7.5  6.5 - 8.1 g/dL Final  . Albumin 01/31/2018 4.1  3.5 - 5.0 g/dL Final  . AST 01/31/2018 23  15 - 41 U/L Final  . ALT 01/31/2018 19  14 - 54 U/L Final  . Alkaline Phosphatase 01/31/2018 93  38 - 126 U/L Final  . Total Bilirubin 01/31/2018 0.9  0.3 - 1.2 mg/dL Final  . GFR calc non Af Amer 01/31/2018 >60  >60 mL/min Final  . GFR calc Af Amer 01/31/2018 >60  >60 mL/min Final   Comment: (NOTE) The eGFR has been calculated using the CKD EPI equation. This calculation has not been validated in all clinical situations. eGFR's persistently <60 mL/min signify possible Chronic Kidney Disease.   Georgiann Hahn gap 01/31/2018 8  5 - 15 Final   Performed at [facility], [address] [place], [place] [address]    Assessment:  Heather Zamora is a 52 y.o. 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  Pathology revealed a 9 mm typical carcinoid tumor.  Margins were clear.  Pathologic stage was T1aNx.  Chest CT on 02/01/2017 revealed some interval evolution of peripheral right  lung scar or development of associated atelectasis.  Chest CT angiogram on 12/04/2017 revealed no evidence of pulmonary embolism.  There was peripheral scarring in RIGHT lung with interval decrease in size of a small nodular focus at the lateral base of the RIGHT middle lobe.  Bilateral screenng mammogram on 02/17/2016 revealed In the right breast, calcifications warrant further evaluation with magnified views. In the left breast, there were no findings suspicious for malignancy.  Diagnostic right mammogram on 02/24/2016 revealed a 14 x 9 x 5 mm group of indeterminate calcifications in the posterior aspect of the upper-outer quadrant of the right breast.  Stereotactic biopsy on 03/04/2016 revealed no atypia or malignancy.  Bilateral mammogram on 03/02/2017 revealed no evidence of malignancy.  Last colonoscopy was 3 years ago.  She had polyps.  She is due for a pelvic exam.  Symptomatically, she notes some chest discomfort.  Pain is in the center of her chest and radiates to her back.  She denies any reflux symptoms.  Exam is unremarkable.  Plan: 1.  Labs today:  CBC with diff, CMP. 2.  Review interval chest CT angiogram- no evidence of disease. 3.  Refer to LDCT program. She will need to follow up with Shawn for referral in 11/2018. She will then qualify for LDCT screening.  4.  RTC second week in February 2020 for MD assessment, labs (CBC with diff, CMP), review of chest CT or low dose chest CT (if she qualifies).   Honor Loh, NP  01/31/2018, 9:42 AM   I saw and evaluated the patient, participating in the key portions of the service and reviewing pertinent diagnostic studies and records.  I reviewed the nurse practitioner's note and agree with the findings and the plan.  The assessment and plan were discussed with the patient. Several questions were asked by the patient and answered.   Nolon Stalls, MD 01/31/2018,9:42 AM

## 2018-02-03 ENCOUNTER — Other Ambulatory Visit: Payer: PRIVATE HEALTH INSURANCE

## 2018-02-03 ENCOUNTER — Ambulatory Visit: Payer: PRIVATE HEALTH INSURANCE | Admitting: Hematology and Oncology

## 2018-03-08 ENCOUNTER — Ambulatory Visit
Admission: RE | Admit: 2018-03-08 | Discharge: 2018-03-08 | Disposition: A | Discharge status: Home / Self Care | Payer: No Typology Code available for payment source | Source: Ambulatory Visit | Attending: Family Medicine | Admitting: Family Medicine

## 2018-03-08 DIAGNOSIS — Z1231 Encounter for screening mammogram for malignant neoplasm of breast: Secondary | ICD-10-CM

## 2018-03-10 ENCOUNTER — Other Ambulatory Visit: Payer: Self-pay | Admitting: Family Medicine

## 2018-03-10 DIAGNOSIS — N6459 Other signs and symptoms in breast: Secondary | ICD-10-CM

## 2018-03-17 ENCOUNTER — Ambulatory Visit
Admission: RE | Admit: 2018-03-17 | Discharge: 2018-03-17 | Disposition: A | Discharge status: Home / Self Care | Payer: No Typology Code available for payment source | Source: Ambulatory Visit | Attending: Family Medicine | Admitting: Family Medicine

## 2018-03-17 DIAGNOSIS — N6459 Other signs and symptoms in breast: Secondary | ICD-10-CM | POA: Diagnosis not present

## 2018-03-20 ENCOUNTER — Other Ambulatory Visit (HOSPITAL_COMMUNITY): Payer: Self-pay | Admitting: Family Medicine

## 2018-03-20 DIAGNOSIS — N6459 Other signs and symptoms in breast: Secondary | ICD-10-CM

## 2018-03-28 ENCOUNTER — Ambulatory Visit (HOSPITAL_COMMUNITY)
Admission: RE | Admit: 2018-03-28 | Discharge: 2018-03-28 | Disposition: A | Discharge status: Home / Self Care | Payer: No Typology Code available for payment source | Source: Ambulatory Visit | Attending: Family Medicine | Admitting: Family Medicine

## 2018-03-28 DIAGNOSIS — Z86010 Personal history of colonic polyps: Secondary | ICD-10-CM | POA: Insufficient documentation

## 2018-03-28 DIAGNOSIS — R12 Heartburn: Secondary | ICD-10-CM | POA: Insufficient documentation

## 2018-03-28 DIAGNOSIS — N6459 Other signs and symptoms in breast: Secondary | ICD-10-CM | POA: Insufficient documentation

## 2018-03-28 DIAGNOSIS — R142 Eructation: Secondary | ICD-10-CM | POA: Insufficient documentation

## 2018-03-28 DIAGNOSIS — R0789 Other chest pain: Secondary | ICD-10-CM | POA: Insufficient documentation

## 2018-03-28 MED ORDER — GADOBENATE DIMEGLUMINE 529 MG/ML IV SOLN
20.0000 mL | Freq: Once | INTRAVENOUS | Status: AC | PRN
  Administered 2018-03-28: 17 mL via INTRAVENOUS

## 2018-05-18 DIAGNOSIS — I25118 Atherosclerotic heart disease of native coronary artery with other forms of angina pectoris: Secondary | ICD-10-CM | POA: Insufficient documentation

## 2018-05-18 DIAGNOSIS — R55 Syncope and collapse: Secondary | ICD-10-CM | POA: Insufficient documentation

## 2018-05-18 DIAGNOSIS — E782 Mixed hyperlipidemia: Secondary | ICD-10-CM | POA: Insufficient documentation

## 2018-06-14 ENCOUNTER — Ambulatory Visit: Admit: 2018-06-14 | Payer: No Typology Code available for payment source | Admitting: Unknown Physician Specialty

## 2018-06-14 SURGERY — ESOPHAGOGASTRODUODENOSCOPY (EGD) WITH PROPOFOL
Anesthesia: General

## 2018-11-06 ENCOUNTER — Telehealth: Payer: Self-pay | Admitting: *Deleted

## 2018-11-06 NOTE — Telephone Encounter (Signed)
Called patient in regards to if she wanted her lab/MD appt scheduled for 12/18/2018 to be R/S to [place] or if she wanted to continue to be seen here at the [place] office. Message was left.

## 2018-11-15 ENCOUNTER — Telehealth: Payer: Self-pay | Admitting: *Deleted

## 2018-11-15 DIAGNOSIS — Z122 Encounter for screening for malignant neoplasm of respiratory organs: Secondary | ICD-10-CM

## 2018-11-15 DIAGNOSIS — Z87891 Personal history of nicotine dependence: Secondary | ICD-10-CM

## 2018-11-15 NOTE — Telephone Encounter (Signed)
Received referral for initial lung cancer screening scan. Contacted patient and obtained smoking history,(former, quit 2013, 64 pack year) as well as answering questions related to screening process. Patient denies signs of lung cancer such as weight loss or hemoptysis. Patient denies comorbidity that would prevent curative treatment if lung cancer were found. Patient is scheduled for shared decision making visit and CT scan on 11/21/18 at 2pm.

## 2018-11-15 NOTE — Telephone Encounter (Signed)
Received referral for low dose lung cancer screening CT scan. Message left at phone number listed in EMR for patient to call me back to facilitate scheduling scan.  

## 2018-11-15 NOTE — Telephone Encounter (Signed)
Entered in error

## 2018-11-21 ENCOUNTER — Inpatient Hospital Stay: Payer: Self-pay | Attending: Oncology | Admitting: Oncology

## 2018-11-21 ENCOUNTER — Ambulatory Visit
Admission: RE | Admit: 2018-11-21 | Discharge: 2018-11-21 | Disposition: A | Discharge status: Home / Self Care | Payer: Self-pay | Source: Ambulatory Visit | Attending: Oncology | Admitting: Oncology

## 2018-11-21 DIAGNOSIS — Z87891 Personal history of nicotine dependence: Secondary | ICD-10-CM | POA: Insufficient documentation

## 2018-11-21 DIAGNOSIS — Z122 Encounter for screening for malignant neoplasm of respiratory organs: Secondary | ICD-10-CM | POA: Insufficient documentation

## 2018-11-21 NOTE — Progress Notes (Signed)
In accordance with CMS guidelines, patient has met eligibility criteria including age, absence of signs or symptoms of lung cancer.  Social History   Tobacco Use  . Smoking status: Former Smoker    Packs/day: 2.00    Years: 32.00    Pack years: 64.00    Types: Cigarettes    Last attempt to quit: 03/2012    Years since quitting: 6.7  . Smokeless tobacco: Never Used  Substance Use Topics  . Alcohol use: No  . Drug use: No     A shared decision-making session was conducted prior to the performance of CT scan. This includes one or more decision aids, includes benefits and harms of screening, follow-up diagnostic testing, over-diagnosis, false positive rate, and total radiation exposure.  Counseling on the importance of adherence to annual lung cancer LDCT screening, impact of co-morbidities, and ability or willingness to undergo diagnosis and treatment is imperative for compliance of the program.  Counseling on the importance of continued smoking cessation for former smokers; the importance of smoking cessation for current smokers, and information about tobacco cessation interventions have been given to patient including [place] and [facility] quit [place] programs.  Written order for lung cancer screening with LDCT has been given to the patient and any and all questions have been answered to the best of my abilities.   Yearly follow up will be coordinated by Burgess Estelle, Thoracic Navigator.  Faythe Casa, NP 11/21/2018 2:52 PM

## 2018-11-22 ENCOUNTER — Encounter: Payer: Self-pay | Admitting: *Deleted

## 2018-11-25 ENCOUNTER — Emergency Department
Admission: EM | Admit: 2018-11-25 | Discharge: 2018-11-25 | Disposition: A | Discharge status: Home / Self Care | Payer: Self-pay | Attending: Emergency Medicine | Admitting: Emergency Medicine

## 2018-11-25 ENCOUNTER — Other Ambulatory Visit: Payer: Self-pay

## 2018-11-25 ENCOUNTER — Emergency Department: Payer: Self-pay

## 2018-11-25 DIAGNOSIS — M25561 Pain in right knee: Secondary | ICD-10-CM

## 2018-11-25 DIAGNOSIS — Z79899 Other long term (current) drug therapy: Secondary | ICD-10-CM | POA: Insufficient documentation

## 2018-11-25 DIAGNOSIS — Y939 Activity, unspecified: Secondary | ICD-10-CM | POA: Insufficient documentation

## 2018-11-25 DIAGNOSIS — Z85118 Personal history of other malignant neoplasm of bronchus and lung: Secondary | ICD-10-CM | POA: Insufficient documentation

## 2018-11-25 DIAGNOSIS — R2241 Localized swelling, mass and lump, right lower limb: Secondary | ICD-10-CM | POA: Insufficient documentation

## 2018-11-25 DIAGNOSIS — Z9049 Acquired absence of other specified parts of digestive tract: Secondary | ICD-10-CM | POA: Insufficient documentation

## 2018-11-25 DIAGNOSIS — J449 Chronic obstructive pulmonary disease, unspecified: Secondary | ICD-10-CM | POA: Insufficient documentation

## 2018-11-25 DIAGNOSIS — M7051 Other bursitis of knee, right knee: Secondary | ICD-10-CM | POA: Insufficient documentation

## 2018-11-25 DIAGNOSIS — Z87891 Personal history of nicotine dependence: Secondary | ICD-10-CM | POA: Insufficient documentation

## 2018-11-25 MED ORDER — IBUPROFEN 800 MG PO TABS
800.0000 mg | ORAL_TABLET | Freq: Once | ORAL | Status: AC
  Administered 2018-11-25: 800 mg via ORAL
  Filled 2018-11-25: qty 1

## 2018-11-25 MED ORDER — HYDROCODONE-ACETAMINOPHEN 5-325 MG PO TABS: 1.0000 | ORAL_TABLET | Freq: Four times a day (QID) | ORAL | 0 refills | Status: AC | PRN

## 2018-11-25 MED ORDER — HYDROCODONE-ACETAMINOPHEN 5-325 MG PO TABS
1.0000 | ORAL_TABLET | Freq: Once | ORAL | Status: AC
  Administered 2018-11-25: 1 via ORAL
  Filled 2018-11-25: qty 1

## 2018-11-25 MED ORDER — IBUPROFEN 800 MG PO TABS: 800.0000 mg | ORAL_TABLET | Freq: Three times a day (TID) | ORAL | 0 refills | Status: DC | PRN

## 2018-11-25 NOTE — Discharge Instructions (Signed)
1.  You may take pain medicines as needed (Motrin/Norco #15). 2.  You may remove Ace wrap to bathe and sleep. 3.  Use walker to help you balance that she walk. 4.  Return to the ER for worsening symptoms, increased swelling, difficulty breathing or other concerns.

## 2018-11-25 NOTE — ED Provider Notes (Signed)
[facility] Emergency Department Provider Note   ____________________________________________   First MD Initiated Contact with Patient 11/25/18 0335     (approximate)  I have reviewed the triage vital signs and the nursing notes.   HISTORY  Chief Complaint Leg Pain and Leg Swelling    HPI Heather Zamora is a 53 y.o. female who presents to the ED from home with a chief complaint of nontraumatic right knee pain.  Patient reports a 2-day history of right knee pain associated with leg swelling.  States it is painful to stand and ambulate, and bending her knee is painful.  Denies extremity weakness, numbness or tingling.  Denies trauma, twisting, overexertion.  Denies recent travel or hormone use.  Denies history of electrolyte imbalance.  Denies fever, chills, chest pain, shortness of breath, abdominal pain, nausea or vomiting.   Past Medical History:  Diagnosis Date  . Cancer [facility]) Right Lung   2013  . COPD (chronic obstructive pulmonary disease) ([place])   . Pulmonary hypertension [facility])    doctor gave okay for procedure today    Patient Active Problem List   Diagnosis Date Noted  . 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 BREAST BIOPSY Right 03/04/2016   benign  . CHOLECYSTECTOMY    . cystectomy ovary    . ovarian cyst removed      Prior to Admission medications   Medication Sig Start Date End Date Taking? Authorizing Provider  cyclobenzaprine (FLEXERIL) 5 MG tablet Take 2 tablet (10 MG) at bedtime as needed.  May cause drowsiness so it is recommended to only take this at night. 01/23/18   [provider]  HYDROcodone-acetaminophen (NORCO) 5-325 MG tablet Take 1 tablet by mouth every 6 (six) hours as needed for moderate pain. 11/25/18   Paulette Blanch, MD  ibuprofen (ADVIL,MOTRIN) 800 MG tablet Take 1 tablet (800 mg total) by mouth every 8 (eight) hours as needed for moderate pain.  11/25/18   Paulette Blanch, MD  Multiple Vitamin (MULTI-VITAMINS) TABS Take by mouth every morning.     [provider]  naproxen sodium (ALEVE) 220 MG tablet Take 220 mg by mouth 2 (two) times daily as needed.     [provider]  pantoprazole (PROTONIX) 40 MG tablet Take 40 mg by mouth daily.     [provider]    Allergies Penicillins and Tape  Family History  Problem Relation Age of Onset  . Breast cancer Maternal Aunt        40's    Social History Social History   Tobacco Use  . Smoking status: Former Smoker    Packs/day: 2.00    Years: 32.00    Pack years: 64.00    Types: Cigarettes    Last attempt to quit: 03/2012    Years since quitting: 6.7  . Smokeless tobacco: Never Used  Substance Use Topics  . Alcohol use: No  . Drug use: No    Review of Systems  Constitutional: No fever/chills Eyes: No visual changes. ENT: No sore throat. Cardiovascular: Denies chest pain. Respiratory: Denies shortness of breath. Gastrointestinal: No abdominal pain.  No nausea, no vomiting.  No diarrhea.  No constipation. Genitourinary: Negative for dysuria. Musculoskeletal: Positive for right knee pain and swelling.  Negative for back pain. Skin: Negative for rash. Neurological: Negative for headaches, focal weakness or numbness.   ____________________________________________   PHYSICAL EXAM:  VITAL SIGNS: ED  Triage Vitals  Enc Vitals Group     BP 11/25/18 0016 124/64     Pulse Rate 11/25/18 0016 72     Resp 11/25/18 0016 18     Temp 11/25/18 0016 98.1 F (36.7 C)     Temp Source 11/25/18 0016 Oral     SpO2 11/25/18 0016 98 %     Weight --      Height --      Head Circumference --      Peak Flow --      Pain Score 11/25/18 0011 9     Pain Loc --      Pain Edu? --      Excl. in [place]? --     Constitutional: Alert and oriented. Well appearing and in no acute distress. Eyes: Conjunctivae are normal. PERRL. EOMI. Head: Atraumatic. Nose: No  congestion/rhinnorhea. Mouth/Throat: Mucous membranes are moist.  Oropharynx non-erythematous. Neck: No stridor.   Cardiovascular: Normal rate, regular rhythm. Grossly normal heart sounds.  Good peripheral circulation. Respiratory: Normal respiratory effort.  No retractions. Lungs CTAB. Gastrointestinal: Soft and nontender. No distention. No abdominal bruits. No CVA tenderness. Musculoskeletal:  Right knee: Mild effusion to lateral knee without overlying warmth or erythema.  Limited range of motion secondary to pain.  No calf tenderness or Homans sign.  2+ distal pulses.  Brisk, less than 5-second capillary refill.  Symmetrically warm limbs without evidence for ischemia. No pedal edema. Neurologic:  Normal speech and language. No gross focal neurologic deficits are appreciated.  Skin:  Skin is warm, dry and intact. No rash noted. Psychiatric: Mood and affect are normal. Speech and behavior are normal.  ____________________________________________   LABS (all labs ordered are listed, but only abnormal results are displayed)  Labs Reviewed - No data to display ____________________________________________  EKG  None ____________________________________________  RADIOLOGY  ED MD interpretation: No DVT  Official radiology report(s): US Venous Img Lower Unilateral Right  Result Date: 11/25/2018 CLINICAL DATA:  Pain and swelling for 2 days. EXAM: RIGHT LOWER EXTREMITY VENOUS DOPPLER ULTRASOUND TECHNIQUE: Gray-scale sonography with graded compression, as well as color Doppler and duplex ultrasound were performed to evaluate the lower extremity deep venous systems from the level of the common femoral vein and including the common femoral, femoral, profunda femoral, popliteal and calf veins including the posterior tibial, peroneal and gastrocnemius veins when visible. The superficial great saphenous vein was also interrogated. Spectral Doppler was utilized to evaluate flow at rest and with  distal augmentation maneuvers in the common femoral, femoral and popliteal veins. COMPARISON:  None. FINDINGS: Normal flow, compressibility, and augmentation within the right common femoral, proximal saphenous, profunda femoral, femoral, popliteal, and imaged calf veins. Limited imaging of the left common femoral vein is normal. IMPRESSION: No evidence of right lower extremity deep venous thrombosis. Electronically Signed   By: Abigail Miyamoto M.D.   On: 11/25/2018 01:19    ____________________________________________   PROCEDURES  Procedure(s) performed: None  Procedures  Critical Care performed: No  ____________________________________________   INITIAL IMPRESSION / ASSESSMENT AND PLAN / ED COURSE  As part of my medical decision making, I reviewed the following data within the [facility] notes reviewed and incorporated, Radiograph reviewed and Notes from prior ED visits   53 year old female who presents with right knee bursitis.  Will place an Ace wrap, provide walker, placed on Motrin and Norco to use as needed.  Strict return precautions given.  Patient verbalizes understanding and agrees with plan of care.  ____________________________________________   FINAL CLINICAL IMPRESSION(S) / ED DIAGNOSES  Final diagnoses:  Acute pain of right knee  Bursitis of right knee, unspecified bursa     ED Discharge Orders         Ordered    ibuprofen (ADVIL,MOTRIN) 800 MG tablet  Every 8 hours PRN     11/25/18 0354    HYDROcodone-acetaminophen (NORCO) 5-325 MG tablet  Every 6 hours PRN     11/25/18 0354           Note:  This document was prepared using Dragon voice recognition software and may include unintentional dictation errors.    Paulette Blanch, MD 11/25/18 [phone]

## 2018-11-25 NOTE — ED Triage Notes (Signed)
Reports pain and swelling to right leg for the past 2 days.  Concerned could be a blood clot.

## 2018-12-18 ENCOUNTER — Inpatient Hospital Stay: Payer: Self-pay | Attending: Oncology

## 2018-12-18 ENCOUNTER — Other Ambulatory Visit: Payer: Self-pay

## 2018-12-18 ENCOUNTER — Encounter: Payer: Self-pay | Admitting: Oncology

## 2018-12-18 ENCOUNTER — Inpatient Hospital Stay (HOSPITAL_BASED_OUTPATIENT_CLINIC_OR_DEPARTMENT_OTHER): Payer: Self-pay | Admitting: Oncology

## 2018-12-18 VITALS — BP 137/84 | HR 69 | Temp 98.0°F | Wt 192.2 lb

## 2018-12-18 DIAGNOSIS — R0602 Shortness of breath: Secondary | ICD-10-CM

## 2018-12-18 DIAGNOSIS — D3A090 Benign carcinoid tumor of the bronchus and lung: Secondary | ICD-10-CM

## 2018-12-18 DIAGNOSIS — Z87891 Personal history of nicotine dependence: Secondary | ICD-10-CM

## 2018-12-18 DIAGNOSIS — C7A090 Malignant carcinoid tumor of the bronchus and lung: Secondary | ICD-10-CM | POA: Insufficient documentation

## 2018-12-18 DIAGNOSIS — R197 Diarrhea, unspecified: Secondary | ICD-10-CM

## 2018-12-18 DIAGNOSIS — R05 Cough: Secondary | ICD-10-CM | POA: Insufficient documentation

## 2018-12-18 LAB — CBC WITH DIFFERENTIAL/PLATELET
Abs Immature Granulocytes: 0.01 10*3/uL (ref 0.00–0.07)
Basophils Absolute: 0.1 10*3/uL (ref 0.0–0.1)
Basophils Relative: 1 %
Eosinophils Absolute: 0.2 10*3/uL (ref 0.0–0.5)
Eosinophils Relative: 2 %
HCT: 37.3 % (ref 36.0–46.0)
Hemoglobin: 12 g/dL (ref 12.0–15.0)
Immature Granulocytes: 0 %
Lymphocytes Relative: 34 %
Lymphs Abs: 2.3 10*3/uL (ref 0.7–4.0)
MCH: 28.7 pg (ref 26.0–34.0)
MCHC: 32.2 g/dL (ref 30.0–36.0)
MCV: 89.2 fL (ref 80.0–100.0)
Monocytes Absolute: 0.5 10*3/uL (ref 0.1–1.0)
Monocytes Relative: 7 %
Neutro Abs: 3.7 10*3/uL (ref 1.7–7.7)
Neutrophils Relative %: 56 %
Platelets: 242 10*3/uL (ref 150–400)
RBC: 4.18 MIL/uL (ref 3.87–5.11)
RDW: 12.3 % (ref 11.5–15.5)
WBC: 6.7 10*3/uL (ref 4.0–10.5)
nRBC: 0 % (ref 0.0–0.2)

## 2018-12-18 LAB — COMPREHENSIVE METABOLIC PANEL
ALT: 27 U/L (ref 0–44)
AST: 22 U/L (ref 15–41)
Albumin: 4.1 g/dL (ref 3.5–5.0)
Alkaline Phosphatase: 80 U/L (ref 38–126)
Anion gap: 5 (ref 5–15)
BUN: 15 mg/dL (ref 6–20)
CO2: 27 mmol/L (ref 22–32)
Calcium: 8.9 mg/dL (ref 8.9–10.3)
Chloride: 105 mmol/L (ref 98–111)
Creatinine, Ser: 0.8 mg/dL (ref 0.44–1.00)
GFR calc Af Amer: >60 mL/min (ref >60)
GFR calc non Af Amer: >60 mL/min (ref >60)
Glucose, Bld: 101 mg/dL — ABNORMAL HIGH (ref 70–99)
Potassium: 4.5 mmol/L (ref 3.5–5.1)
Sodium: 137 mmol/L (ref 135–145)
Total Bilirubin: 0.8 mg/dL (ref 0.3–1.2)
Total Protein: 7 g/dL (ref 6.5–8.1)

## 2018-12-20 NOTE — Progress Notes (Signed)
[facility] day:  12/20/2018  Chief Complaint: Heather Zamora is a 53 y.o. female with stage IA pulmonary carcinoid who is seen for 1 year assessment.  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  Patient previously follows up with Dr. Mike Gip.  Establish care with me on 12/20/2018.  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  Pathology revealed a 9 mm typical carcinoid tumor.  Margins were clear.  Pathologic stage was T1aNx.  Chest CT on 02/01/2017 revealed some interval evolution of peripheral right lung scar or development of associated atelectasis.  Chest CT angiogram on 12/04/2017 revealed no evidence of pulmonary embolism.  There was peripheral scarring in RIGHT lung with interval decrease in size of a small nodular focus at the lateral base of the RIGHT middle lobe.  Bilateral screenng mammogram on 02/17/2016 revealed In the right breast, calcifications warrant further evaluation with magnified views. In the left breast, there were no findings suspicious for malignancy.  Diagnostic right mammogram on 02/24/2016 revealed a 14 x 9 x 5 mm group of indeterminate calcifications in the posterior aspect of the upper-outer quadrant of the right breast.  Stereotactic biopsy on 03/04/2016 revealed no atypia or malignancy.  Bilateral mammogram on 03/02/2017 revealed no evidence of malignancy.  Last colonoscopy was 3 years ago.  She had polyps.  She is due for a pelvic exam.  During the interim, patient reports doing well.  Denies facial flush. She has shortness of breath, cough Reports diarrhea for about 4 weeks.  Not associated with food, abdominal pain.  No fever or chills. Weight has increased 9 pounds since last visit January 31, 2018.  Patient had a ED visit on 11/25/2018 with chief complaint of nontraumatic right knee pain.  Associated with leg swelling.  Was thought to have bursitis.  Patient was provided with Ace wrap, provide a walker, prescribed Norco and Motrin as needed  Review of Systems  Constitutional: Negative for appetite change, chills, fatigue and fever.  HENT:   Negative for hearing loss and voice change.   Eyes: Negative for eye problems.  Respiratory: Negative for chest tightness and cough.   Cardiovascular: Negative for chest pain.  Gastrointestinal: Negative for abdominal distention, abdominal pain and blood in stool.  Endocrine: Negative for hot flashes.  Genitourinary: Negative for difficulty urinating and frequency.   Musculoskeletal: Negative for arthralgias.  Skin: Negative for itching and rash.  Neurological: Negative for extremity weakness.  Hematological: Negative for adenopathy.  Psychiatric/Behavioral: Negative for confusion.     Past Medical History:  Diagnosis Date  . Cancer [facility]) Right Lung   2013  . COPD (chronic obstructive pulmonary disease) ([place])   . Pulmonary hypertension [facility])    doctor gave okay for procedure today    Past Surgical History:  Procedure Laterality Date  . BREAST BIOPSY Right 03/04/2016   benign  . CHOLECYSTECTOMY    . cystectomy ovary    . ovarian cyst removed      Family History  Problem Relation Age of Onset  . Breast cancer Maternal Aunt        40's   Social History   Socioeconomic History  . Marital status: Married    Spouse name: Not on file  . Number of children: Not on file  .  Years of education: Not on file  . Highest education level: Not on file  Occupational History  . Not on file  Social Needs  . Financial resource strain: Not on file  . Food insecurity:    Worry: Not on file    Inability: Not on file  . Transportation needs:    Medical: Not on file    Non-medical: Not on file  Tobacco Use  . Smoking status: Former  Smoker    Packs/day: 2.00    Years: 32.00    Pack years: 64.00    Types: Cigarettes    Last attempt to quit: 03/2012    Years since quitting: 6.7  . Smokeless tobacco: Never Used  Substance and Sexual Activity  . Alcohol use: No  . Drug use: No  . Sexual activity: Yes  Lifestyle  . Physical activity:    Days per week: Not on file    Minutes per session: Not on file  . Stress: Not on file  Relationships  . Social connections:    Talks on phone: Not on file    Gets together: Not on file    Attends religious service: Not on file    Active member of club or organization: Not on file    Attends meetings of clubs or organizations: Not on file    Relationship status: Not on file  . Intimate partner violence:    Fear of current or ex partner: Not on file    Emotionally abused: Not on file    Physically abused: Not on file    Forced sexual activity: Not on file  Other Topics Concern  . Not on file  Social History Narrative  . Not on file    Allergies:  Allergies  Allergen Reactions  . Penicillins Hives  . Tape Rash    Current Medications: Current Outpatient Medications  Medication Sig Dispense Refill  . cyclobenzaprine (FLEXERIL) 5 MG tablet Take 2 tablet (10 MG) at bedtime as needed.  May cause drowsiness so it is recommended to only take this at night.    Marland Kitchen ibuprofen (ADVIL,MOTRIN) 800 MG tablet Take 1 tablet (800 mg total) by mouth every 8 (eight) hours as needed for moderate pain. 15 tablet 0  . Multiple Vitamin (MULTI-VITAMINS) TABS Take by mouth every morning.     . pantoprazole (PROTONIX) 40 MG tablet Take 40 mg by mouth daily.     Marland Kitchen HYDROcodone-acetaminophen (NORCO) 5-325 MG tablet Take 1 tablet by mouth every 6 (six) hours as needed for moderate pain. (Patient not taking: Reported on 12/18/2018) 15 tablet 0  . naproxen sodium (ALEVE) 220 MG tablet Take 220 mg by mouth 2 (two) times daily as needed.      No current facility-administered medications for this visit.       Physical Exam: Blood pressure 137/84, pulse 69, temperature 98 F (36.7 C), temperature source Oral, weight 192 lb 3.2 oz (87.2 kg), last menstrual period 03/02/2011.  Physical Exam  Constitutional: She is oriented to person, place, and time. No distress.  HENT:  Head: Normocephalic and atraumatic.  Nose: Nose normal.  Mouth/Throat: Oropharynx is clear and moist. No oropharyngeal exudate.  Eyes: Pupils are equal, round, and reactive to light. EOM are normal. No scleral icterus.  Neck: Normal range of motion. Neck supple.  Cardiovascular: Normal rate and regular rhythm.  No murmur heard. Pulmonary/Chest: Effort normal. No respiratory distress. She has no rales. She exhibits no tenderness.  Abdominal: Soft. She exhibits no distension. There  is no abdominal tenderness.  Musculoskeletal: Normal range of motion.        General: No edema.  Neurological: She is alert and oriented to person, place, and time. No cranial nerve deficit. She exhibits normal muscle tone. Coordination normal.  Skin: Skin is warm and dry. She is not diaphoretic. No erythema.  Psychiatric: Affect normal.     Appointment on 12/18/2018  Component Date Value Ref Range Status  . Sodium 12/18/2018 137  135 - 145 mmol/L Final  . Potassium 12/18/2018 4.5  3.5 - 5.1 mmol/L Final  . Chloride 12/18/2018 105  98 - 111 mmol/L Final  . CO2 12/18/2018 27  22 - 32 mmol/L Final  . Glucose, Bld 12/18/2018 101* 70 - 99 mg/dL Final  . BUN 12/18/2018 15  6 - 20 mg/dL Final  . Creatinine, Ser 12/18/2018 0.80  0.44 - 1.00 mg/dL Final  . Calcium 12/18/2018 8.9  8.9 - 10.3 mg/dL Final  . Total Protein 12/18/2018 7.0  6.5 - 8.1 g/dL Final  . Albumin 12/18/2018 4.1  3.5 - 5.0 g/dL Final  . AST 12/18/2018 22  15 - 41 U/L Final  . ALT 12/18/2018 27  0 - 44 U/L Final  . Alkaline Phosphatase 12/18/2018 80  38 - 126 U/L Final  . Total Bilirubin 12/18/2018 0.8  0.3 - 1.2 mg/dL Final  . GFR calc non Af Amer 12/18/2018 >60  >60 mL/min  Final  . GFR calc Af Amer 12/18/2018 >60  >60 mL/min Final  . Anion gap 12/18/2018 5  5 - 15 Final   Performed at [facility], [address] [place], [place] [address]  . WBC 12/18/2018 6.7  4.0 - 10.5 K/uL Final  . RBC 12/18/2018 4.18  3.87 - 5.11 MIL/uL Final  . Hemoglobin 12/18/2018 12.0  12.0 - 15.0 g/dL Final  . HCT 12/18/2018 37.3  36.0 - 46.0 % Final  . MCV 12/18/2018 89.2  80.0 - 100.0 fL Final  . MCH 12/18/2018 28.7  26.0 - 34.0 pg Final  . MCHC 12/18/2018 32.2  30.0 - 36.0 g/dL Final  . RDW 12/18/2018 12.3  11.5 - 15.5 % Final  . Platelets 12/18/2018 242  150 - 400 K/uL Final  . nRBC 12/18/2018 0.0  0.0 - 0.2 % Final  . Neutrophils Relative % 12/18/2018 56  % Final  . Neutro Abs 12/18/2018 3.7  1.7 - 7.7 K/uL Final  . Lymphocytes Relative 12/18/2018 34  % Final  . Lymphs Abs 12/18/2018 2.3  0.7 - 4.0 K/uL Final  . Monocytes Relative 12/18/2018 7  % Final  . Monocytes Absolute 12/18/2018 0.5  0.1 - 1.0 K/uL Final  . Eosinophils Relative 12/18/2018 2  % Final  . Eosinophils Absolute 12/18/2018 0.2  0.0 - 0.5 K/uL Final  . Basophils Relative 12/18/2018 1  % Final  . Basophils Absolute 12/18/2018 0.1  0.0 - 0.1 K/uL Final  . Immature Granulocytes 12/18/2018 0  % Final  . Abs Immature Granulocytes 12/18/2018 0.01  0.00 - 0.07 K/uL Final   Performed at [facility], [address] [place], [place] [address]    Assessment:  Heather Zamora is a 53 y.o. female with history of stage IA carcinoid presents for follow-up. 1. Carcinoid tumor of right lung   2. Diarrhea, unspecified type     #Clinically doing well.  I have discussed the entire medical history, diagnosis management of pulmonary carcinoid with patient.  Patient has had CT chest lung cancer screening  done on 11/21/2018.  No evidence of recurrence. CT scan was independently reviewed and discussed with patient. Benign appearance.  Continue annual screening with low-dose chest CT without contrast in 12  months.  #Diarrhea, etiology unknown.  Will check C. difficile.   Advised patient to update me if diarrhea symptoms worsened or been persistent.  Follow up in 1 year. Cbc, cmp Orders Placed This Encounter  Procedures  . C Difficile Quick Screen w PCR reflex    Standing Status:   Future    Standing Expiration Date:   12/19/2019  . CBC with Differential/Platelet    Standing Status:   Future    Standing Expiration Date:   12/17/2020  . Comprehensive metabolic panel    Standing Status:   Future    Standing Expiration Date:   12/17/2020    We spent sufficient time to discuss many aspect of care, questions were answered to patient's satisfaction. Total face to face encounter time for this patient visit was 25 min. >50% of the time was  spent in counseling and coordination of care.   Earlie Server, MD, PhD Hematology Oncology [facility] Pager- [phone] 12/20/2018

## 2019-11-21 ENCOUNTER — Telehealth: Payer: Self-pay

## 2019-11-21 DIAGNOSIS — Z87891 Personal history of nicotine dependence: Secondary | ICD-10-CM

## 2019-11-21 NOTE — Telephone Encounter (Signed)
Patient has been notified that lung cancer screening CT scan is due currently or will be in near future. Confirmed that patient is within the appropriate age range, and asymptomatic, (no signs or symptoms of lung cancer). Patient denies illness that would prevent curative treatment for lung cancer if found. Verified smoking history (quit 2013) Patient is agreeable for CT scan being scheduled any time/day

## 2019-11-23 NOTE — Telephone Encounter (Signed)
Smoking history: former, quit 2013, 64 pack year

## 2019-11-23 NOTE — Addendum Note (Signed)
Addended by: Lieutenant Diego on: 11/23/2019 11:09 AM   Modules accepted: Orders

## 2019-11-28 ENCOUNTER — Ambulatory Visit
Admission: RE | Admit: 2019-11-28 | Discharge: 2019-11-28 | Disposition: A | Discharge status: Home / Self Care | Payer: Self-pay | Source: Ambulatory Visit | Attending: Nurse Practitioner | Admitting: Nurse Practitioner

## 2019-11-28 ENCOUNTER — Other Ambulatory Visit: Payer: Self-pay

## 2019-11-28 DIAGNOSIS — Z87891 Personal history of nicotine dependence: Secondary | ICD-10-CM | POA: Insufficient documentation

## 2019-11-30 ENCOUNTER — Encounter: Payer: Self-pay | Admitting: *Deleted

## 2019-12-17 ENCOUNTER — Other Ambulatory Visit: Payer: Self-pay

## 2019-12-17 ENCOUNTER — Inpatient Hospital Stay: Payer: Self-pay

## 2019-12-17 ENCOUNTER — Inpatient Hospital Stay: Payer: Self-pay | Attending: Oncology

## 2019-12-17 DIAGNOSIS — D3A090 Benign carcinoid tumor of the bronchus and lung: Secondary | ICD-10-CM

## 2019-12-17 DIAGNOSIS — C7A090 Malignant carcinoid tumor of the bronchus and lung: Secondary | ICD-10-CM | POA: Insufficient documentation

## 2019-12-17 DIAGNOSIS — R197 Diarrhea, unspecified: Secondary | ICD-10-CM

## 2019-12-17 LAB — COMPREHENSIVE METABOLIC PANEL
ALT: 19 U/L (ref 0–44)
AST: 19 U/L (ref 15–41)
Albumin: 4 g/dL (ref 3.5–5.0)
Alkaline Phosphatase: 80 U/L (ref 38–126)
Anion gap: 8 (ref 5–15)
BUN: 18 mg/dL (ref 6–20)
CO2: 25 mmol/L (ref 22–32)
Calcium: 9 mg/dL (ref 8.9–10.3)
Chloride: 104 mmol/L (ref 98–111)
Creatinine, Ser: 0.81 mg/dL (ref 0.44–1.00)
GFR calc Af Amer: >60 mL/min (ref >60)
GFR calc non Af Amer: >60 mL/min (ref >60)
Glucose, Bld: 102 mg/dL — ABNORMAL HIGH (ref 70–99)
Potassium: 4.1 mmol/L (ref 3.5–5.1)
Sodium: 137 mmol/L (ref 135–145)
Total Bilirubin: 1 mg/dL (ref 0.3–1.2)
Total Protein: 7.1 g/dL (ref 6.5–8.1)

## 2019-12-17 LAB — CBC WITH DIFFERENTIAL/PLATELET
Abs Immature Granulocytes: 0.02 10*3/uL (ref 0.00–0.07)
Basophils Absolute: 0.1 10*3/uL (ref 0.0–0.1)
Basophils Relative: 1 %
Eosinophils Absolute: 0.1 10*3/uL (ref 0.0–0.5)
Eosinophils Relative: 2 %
HCT: 36.2 % (ref 36.0–46.0)
Hemoglobin: 11.6 g/dL — ABNORMAL LOW (ref 12.0–15.0)
Immature Granulocytes: 0 %
Lymphocytes Relative: 40 %
Lymphs Abs: 2.9 10*3/uL (ref 0.7–4.0)
MCH: 29.1 pg (ref 26.0–34.0)
MCHC: 32 g/dL (ref 30.0–36.0)
MCV: 90.7 fL (ref 80.0–100.0)
Monocytes Absolute: 0.5 10*3/uL (ref 0.1–1.0)
Monocytes Relative: 7 %
Neutro Abs: 3.6 10*3/uL (ref 1.7–7.7)
Neutrophils Relative %: 50 %
Platelets: 254 10*3/uL (ref 150–400)
RBC: 3.99 MIL/uL (ref 3.87–5.11)
RDW: 12 % (ref 11.5–15.5)
WBC: 7.2 10*3/uL (ref 4.0–10.5)
nRBC: 0 % (ref 0.0–0.2)

## 2019-12-18 ENCOUNTER — Encounter: Payer: Self-pay | Admitting: Oncology

## 2019-12-18 ENCOUNTER — Inpatient Hospital Stay (HOSPITAL_BASED_OUTPATIENT_CLINIC_OR_DEPARTMENT_OTHER): Payer: Self-pay | Admitting: Oncology

## 2019-12-18 DIAGNOSIS — D3A090 Benign carcinoid tumor of the bronchus and lung: Secondary | ICD-10-CM

## 2019-12-18 DIAGNOSIS — Z87891 Personal history of nicotine dependence: Secondary | ICD-10-CM

## 2019-12-18 DIAGNOSIS — R197 Diarrhea, unspecified: Secondary | ICD-10-CM

## 2019-12-18 NOTE — Progress Notes (Signed)
HEMATOLOGY-ONCOLOGY TeleHEALTH VISIT PROGRESS NOTE  I connected with Heather Zamora on 12/18/19 at  2:00 PM EST by video enabled telemedicine visit and verified that I am speaking with the correct person using two identifiers. I discussed the limitations, risks, security and privacy concerns of performing an evaluation and management service by telemedicine and the availability of in-person appointments. I also discussed with the patient that there may be a patient responsible charge related to this service. The patient expressed understanding and agreed to proceed.   Other persons participating in the visit and their role in the encounter:  None  Patient's location: Home  Provider's location: office Chief Complaint: Lung carcinoid tumor   Heather Zamora is a 54 y.o. female who has above history reviewed by me today presents for follow up visit for management of history of stage Ia lung carcinoid tumor Problems and complaints are listed below:  Denies any cough, wheezing. With further questioning, she reports that she has a few weeks of diarrhea recently. During her last visit 1 year ago she also mentioned some diarrhea symptoms which later spontaneously resolved.  Coincidentally she started to have diarrhea recently again.  Denies any exacerbating or alleviating factors. Otherwise no new complaints.  Review of Systems - Oncology  Past Medical History:  Diagnosis Date  . Cancer [facility]) Right Lung   2013  . COPD (chronic obstructive pulmonary disease) ([place])   . Pulmonary hypertension [facility])    doctor gave okay for procedure today   Past Surgical History:  Procedure Laterality Date  . BREAST BIOPSY Right 03/04/2016   benign  . CHOLECYSTECTOMY    . cystectomy ovary    . ovarian cyst removed      Family History  Problem Relation Age of Onset  . Breast cancer Maternal Aunt        40's    Social History   Socioeconomic History  . Marital status: Married     Spouse name: Not on file  . Number of children: Not on file  . Years of education: Not on file  . Highest education level: Not on file  Occupational History  . Not on file  Tobacco Use  . Smoking status: Former Smoker    Packs/day: 2.00    Years: 32.00    Pack years: 64.00    Types: Cigarettes    Quit date: 03/2012    Years since quitting: 7.7  . Smokeless tobacco: Never Used  Substance and Sexual Activity  . Alcohol use: No  . Drug use: No  . Sexual activity: Yes  Other Topics Concern  . Not on file  Social History Narrative  . Not on file   Social Determinants of Health   Financial Resource Strain:   . Difficulty of Paying Living Expenses: Not on file  Food Insecurity:   . Worried About Charity fundraiser in the Last Year: Not on file  . Ran Out of Food in the Last Year: Not on file  Transportation Needs:   . Lack of Transportation (Medical): Not on file  . Lack of Transportation (Non-Medical): Not on file  Physical Activity:   . Days of Exercise per Week: Not on file  . Minutes of Exercise per Session: Not on file  Stress:   . Feeling of Stress : Not on file  Social Connections:   . Frequency of Communication with Friends and Family: Not on file  . Frequency of Social Gatherings with Friends and Family: Not  on file  . Attends Religious Services: Not on file  . Active Member of Clubs or Organizations: Not on file  . Attends Archivist Meetings: Not on file  . Marital Status: Not on file  Intimate Partner Violence:   . Fear of Current or Ex-Partner: Not on file  . Emotionally Abused: Not on file  . Physically Abused: Not on file  . Sexually Abused: Not on file    Current Outpatient Medications on File Prior to Visit  Medication Sig Dispense Refill  . Cholecalciferol (VITAMIN D) 50 MCG (2000 UT) tablet Take 2,000 Units by mouth daily.    Marland Kitchen ibuprofen (ADVIL,MOTRIN) 800 MG tablet Take 1 tablet (800 mg total) by mouth every 8 (eight) hours as needed  for moderate pain. 15 tablet 0  . Multiple Vitamin (MULTI-VITAMINS) TABS Take by mouth every morning.     . cyclobenzaprine (FLEXERIL) 5 MG tablet Take 2 tablet (10 MG) at bedtime as needed.  May cause drowsiness so it is recommended to only take this at night.    Marland Kitchen HYDROcodone-acetaminophen (NORCO) 5-325 MG tablet Take 1 tablet by mouth every 6 (six) hours as needed for moderate pain. (Patient not taking: Reported on 12/18/2018) 15 tablet 0  . naproxen sodium (ALEVE) 220 MG tablet Take 220 mg by mouth 2 (two) times daily as needed.     . pantoprazole (PROTONIX) 40 MG tablet Take 40 mg by mouth daily.      No current facility-administered medications on file prior to visit.    Allergies  Allergen Reactions  . Penicillins Hives  . Tape Rash       Observations/Objective: Today's Vitals   12/18/19 1302  PainSc: 0-No pain   There is no height or weight on file to calculate BMI.  Physical Exam  CBC    Component Value Date/Time   WBC 7.2 12/17/2019 1301   RBC 3.99 12/17/2019 1301   HGB 11.6 (L) 12/17/2019 1301   HGB 12.4 01/30/2015 1037   HCT 36.2 12/17/2019 1301   HCT 37.2 01/30/2015 1037   PLT 254 12/17/2019 1301   PLT 246 01/30/2015 1037   MCV 90.7 12/17/2019 1301   MCV 85 01/30/2015 1037   MCH 29.1 12/17/2019 1301   MCHC 32.0 12/17/2019 1301   RDW 12.0 12/17/2019 1301   RDW 13.1 01/30/2015 1037   LYMPHSABS 2.9 12/17/2019 1301   LYMPHSABS 2.2 01/30/2015 1037   MONOABS 0.5 12/17/2019 1301   MONOABS 0.4 01/30/2015 1037   EOSABS 0.1 12/17/2019 1301   EOSABS 0.1 01/30/2015 1037   BASOSABS 0.1 12/17/2019 1301   BASOSABS 0.1 01/30/2015 1037    CMP     Component Value Date/Time   NA 137 12/17/2019 1301   NA 137 01/30/2015 1037   K 4.1 12/17/2019 1301   K 4.2 01/30/2015 1037   CL 104 12/17/2019 1301   CL 102 01/30/2015 1037   CO2 25 12/17/2019 1301   CO2 29 01/30/2015 1037   GLUCOSE 102 (H) 12/17/2019 1301   GLUCOSE 83 01/30/2015 1037   BUN 18 12/17/2019 1301   BUN  15 01/30/2015 1037   CREATININE 0.81 12/17/2019 1301   CREATININE 0.93 01/30/2015 1037   CALCIUM 9.0 12/17/2019 1301   CALCIUM 8.8 (L) 01/30/2015 1037   PROT 7.1 12/17/2019 1301   PROT 7.6 01/30/2015 1037   ALBUMIN 4.0 12/17/2019 1301   ALBUMIN 4.2 01/30/2015 1037   AST 19 12/17/2019 1301   AST 19 01/30/2015 1037   ALT 19  12/17/2019 1301   ALT 16 01/30/2015 1037   ALKPHOS 80 12/17/2019 1301   ALKPHOS 78 01/30/2015 1037   BILITOT 1.0 12/17/2019 1301   BILITOT 0.8 01/30/2015 1037   GFRNONAA >60 12/17/2019 1301   GFRNONAA >60 01/30/2015 1037   GFRAA >60 12/17/2019 1301   GFRAA >60 01/30/2015 1037     Assessment and Plan: 1. Carcinoid tumor of right lung   2. Diarrhea, unspecified type   3. Personal history of tobacco use, presenting hazards to health   History of stage IA lung carcinoid tumor #11/28/2019 chest CT lung cancer screening images were independently reviewed by me and discussed with patient. Stable findings with no new lesions.  Enlargement of main pulmonary artery suggesting pulmonary arterial hypertension.  Emphysema and atherosclerosis. With her history of tobacco use, continue annual CT lung scan.  Diarrhea, unknown etiology.  Although lung carcinoid tumors are less likely to cause carcinoid syndrome comparing to GI carcinoid tumors, I will obtain 5-HIAA and chromogranin A levels. I also discussed that if levels are normal, but symptoms persist, patient should discuss with primary care provider/gastroenterologist for further evaluation of other etiologies.   Follow Up Instructions: 1 year   I discussed the assessment and treatment plan with the patient. The patient was provided an opportunity to ask questions and all were answered. The patient agreed with the plan and demonstrated an understanding of the instructions.  The patient was advised to call back or seek an in-person evaluation if the symptoms worsen or if the condition fails to improve as anticipated.      Earlie Server, MD 12/18/2019 10:34 PM

## 2019-12-18 NOTE — Progress Notes (Signed)
Patient verified using two identifiers for virtual visit via telephone today.  Patient reports recent diarrhea for the past couple of weeks.  Has not tried OTC med

## 2019-12-19 ENCOUNTER — Other Ambulatory Visit: Payer: Self-pay

## 2019-12-19 ENCOUNTER — Ambulatory Visit: Payer: Self-pay | Admitting: Oncology

## 2019-12-20 ENCOUNTER — Inpatient Hospital Stay: Payer: Self-pay

## 2019-12-20 ENCOUNTER — Other Ambulatory Visit: Payer: Self-pay

## 2019-12-20 DIAGNOSIS — D3A090 Benign carcinoid tumor of the bronchus and lung: Secondary | ICD-10-CM

## 2019-12-21 LAB — CHROMOGRANIN A: Chromogranin A (ng/mL): 35.5 ng/mL (ref 0.0–101.8)

## 2020-01-26 ENCOUNTER — Ambulatory Visit: Payer: Self-pay

## 2020-12-05 ENCOUNTER — Other Ambulatory Visit: Payer: Self-pay | Admitting: *Deleted

## 2020-12-05 DIAGNOSIS — Z87891 Personal history of nicotine dependence: Secondary | ICD-10-CM

## 2020-12-05 DIAGNOSIS — Z122 Encounter for screening for malignant neoplasm of respiratory organs: Secondary | ICD-10-CM

## 2020-12-05 NOTE — Progress Notes (Signed)
Contacted and scheduled for annual lung screening scan. Patient is a former smoker with a quit date of 2013, and a 64 pack year history.   Patient is currently uninsured and is aware that there are 3rd party funding available to assist with lung cancer screening for uninsured patients.

## 2020-12-09 ENCOUNTER — Ambulatory Visit
Admission: RE | Admit: 2020-12-09 | Discharge: 2020-12-09 | Disposition: A | Discharge status: Home / Self Care | Payer: Self-pay | Source: Ambulatory Visit | Attending: Nurse Practitioner | Admitting: Nurse Practitioner

## 2020-12-09 ENCOUNTER — Other Ambulatory Visit: Payer: Self-pay

## 2020-12-09 DIAGNOSIS — Z87891 Personal history of nicotine dependence: Secondary | ICD-10-CM | POA: Insufficient documentation

## 2020-12-09 DIAGNOSIS — Z122 Encounter for screening for malignant neoplasm of respiratory organs: Secondary | ICD-10-CM | POA: Insufficient documentation

## 2020-12-11 ENCOUNTER — Encounter: Payer: Self-pay | Admitting: *Deleted

## 2020-12-19 ENCOUNTER — Inpatient Hospital Stay: Payer: Self-pay

## 2020-12-19 ENCOUNTER — Inpatient Hospital Stay: Payer: Self-pay | Admitting: Oncology

## 2020-12-30 ENCOUNTER — Other Ambulatory Visit: Payer: Self-pay

## 2020-12-30 DIAGNOSIS — D3A090 Benign carcinoid tumor of the bronchus and lung: Secondary | ICD-10-CM

## 2020-12-31 ENCOUNTER — Inpatient Hospital Stay: Payer: Self-pay

## 2020-12-31 ENCOUNTER — Inpatient Hospital Stay: Payer: Self-pay | Admitting: Oncology

## 2021-01-13 ENCOUNTER — Encounter: Payer: Self-pay | Admitting: Oncology

## 2021-01-13 ENCOUNTER — Inpatient Hospital Stay: Payer: Self-pay | Attending: Oncology

## 2021-01-13 ENCOUNTER — Inpatient Hospital Stay (HOSPITAL_BASED_OUTPATIENT_CLINIC_OR_DEPARTMENT_OTHER): Payer: Self-pay | Admitting: Oncology

## 2021-01-13 VITALS — BP 118/67 | HR 55 | Temp 98.1°F | Resp 18 | Wt 190.6 lb

## 2021-01-13 DIAGNOSIS — D3A090 Benign carcinoid tumor of the bronchus and lung: Secondary | ICD-10-CM

## 2021-01-13 DIAGNOSIS — Z87891 Personal history of nicotine dependence: Secondary | ICD-10-CM | POA: Insufficient documentation

## 2021-01-13 DIAGNOSIS — R197 Diarrhea, unspecified: Secondary | ICD-10-CM

## 2021-01-13 DIAGNOSIS — Z85110 Personal history of malignant carcinoid tumor of bronchus and lung: Secondary | ICD-10-CM | POA: Insufficient documentation

## 2021-01-13 LAB — CBC WITH DIFFERENTIAL/PLATELET
Abs Immature Granulocytes: 0.02 10*3/uL (ref 0.00–0.07)
Basophils Absolute: 0.1 10*3/uL (ref 0.0–0.1)
Basophils Relative: 1 %
Eosinophils Absolute: 0.1 10*3/uL (ref 0.0–0.5)
Eosinophils Relative: 2 %
HCT: 35.9 % — ABNORMAL LOW (ref 36.0–46.0)
Hemoglobin: 12.1 g/dL (ref 12.0–15.0)
Immature Granulocytes: 0 %
Lymphocytes Relative: 37 %
Lymphs Abs: 2.3 10*3/uL (ref 0.7–4.0)
MCH: 30.7 pg (ref 26.0–34.0)
MCHC: 33.7 g/dL (ref 30.0–36.0)
MCV: 91.1 fL (ref 80.0–100.0)
Monocytes Absolute: 0.5 10*3/uL (ref 0.1–1.0)
Monocytes Relative: 8 %
Neutro Abs: 3.1 10*3/uL (ref 1.7–7.7)
Neutrophils Relative %: 52 %
Platelets: 214 10*3/uL (ref 150–400)
RBC: 3.94 MIL/uL (ref 3.87–5.11)
RDW: 12.2 % (ref 11.5–15.5)
WBC: 6 10*3/uL (ref 4.0–10.5)
nRBC: 0 % (ref 0.0–0.2)

## 2021-01-13 LAB — COMPREHENSIVE METABOLIC PANEL
ALT: 17 U/L (ref 0–44)
AST: 18 U/L (ref 15–41)
Albumin: 4 g/dL (ref 3.5–5.0)
Alkaline Phosphatase: 64 U/L (ref 38–126)
Anion gap: 9 (ref 5–15)
BUN: 14 mg/dL (ref 6–20)
CO2: 26 mmol/L (ref 22–32)
Calcium: 9 mg/dL (ref 8.9–10.3)
Chloride: 106 mmol/L (ref 98–111)
Creatinine, Ser: 0.62 mg/dL (ref 0.44–1.00)
GFR, Estimated: >60 mL/min (ref >60)
Glucose, Bld: 96 mg/dL (ref 70–99)
Potassium: 4.7 mmol/L (ref 3.5–5.1)
Sodium: 141 mmol/L (ref 135–145)
Total Bilirubin: 0.7 mg/dL (ref 0.3–1.2)
Total Protein: 6.9 g/dL (ref 6.5–8.1)

## 2021-01-13 NOTE — Progress Notes (Signed)
Patient here for follow up. No new concerns voiced.  °

## 2021-01-13 NOTE — Progress Notes (Signed)
[facility] day:  01/13/2021  Chief Complaint: Heather Zamora is a 55 y.o. female with stage IA pulmonary carcinoid who is seen for 1 year assessment.  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  Patient previously follows up with Dr. Mike Gip.  Establish care with me on 12/20/2018.  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  Pathology revealed a 9 mm typical carcinoid tumor.  Margins were clear.  Pathologic stage was T1aNx.  Chest CT on 02/01/2017 revealed some interval evolution of peripheral right lung scar or development of associated atelectasis.  Chest CT angiogram on 12/04/2017 revealed no evidence of pulmonary embolism.  There was peripheral scarring in RIGHT lung with interval decrease in size of a small nodular focus at the lateral base of the RIGHT middle lobe.  Bilateral screenng mammogram on 02/17/2016 revealed In the right breast, calcifications warrant further evaluation with magnified views. In the left breast, there were no findings suspicious for malignancy.  Diagnostic right mammogram on 02/24/2016 revealed a 14 x 9 x 5 mm group of indeterminate calcifications in the posterior aspect of the upper-outer quadrant of the right breast.  Stereotactic biopsy on 03/04/2016 revealed no atypia or malignancy.  Bilateral mammogram on 03/02/2017 revealed no evidence of malignancy.  Last colonoscopy was 3 years ago.  She had polyps.  She is due for a pelvic exam.   Heather Zamora is a 55 y.o. female who has above history reviewed by me today presents for follow up visit for management of history of carcinoid cancer of the right lung. Problems and complaints are listed below: Patient reports doing  well. She continues to have intermittent diarrhea.  Chromogranin A was checked previously was normal. No other new complaints.   Review of Systems  Constitutional: Negative for appetite change, chills, fatigue and fever.  HENT:   Negative for hearing loss and voice change.   Eyes: Negative for eye problems.  Respiratory: Negative for chest tightness and cough.   Cardiovascular: Negative for chest pain.  Gastrointestinal: Negative for abdominal distention, abdominal pain and blood in stool.  Endocrine: Negative for hot flashes.  Genitourinary: Negative for difficulty urinating and frequency.   Musculoskeletal: Negative for arthralgias.  Skin: Negative for itching and rash.  Neurological: Negative for extremity weakness.  Hematological: Negative for adenopathy.  Psychiatric/Behavioral: Negative for confusion.     Past Medical History:  Diagnosis Date  . Cancer [facility]) Right Lung   2013  . COPD (chronic obstructive pulmonary disease) ([place])   . Pulmonary hypertension [facility])    doctor gave okay for procedure today    Past Surgical History:  Procedure Laterality Date  . BREAST BIOPSY Right 03/04/2016   benign  . CHOLECYSTECTOMY    . cystectomy ovary    . ovarian cyst removed      Family History  Problem Relation Age of Onset  . Breast cancer Maternal Aunt        40's   Social History   Socioeconomic History  . Marital status: Married    Spouse name: Not on file  . Number of children: Not on file  . Years of education: Not on file  . Highest education level: Not on file  Occupational History  . Not on file  Tobacco Use  . Smoking status: Former Smoker    Packs/day: 2.00    Years: 32.00    Pack years: 64.00    Types: Cigarettes    Quit date: 03/2012    Years since quitting: 8.8  . Smokeless tobacco: Never Used  Vaping Use  . Vaping Use: Never used  Substance and Sexual Activity  . Alcohol use: No  . Drug use: No  . Sexual activity: Yes  Other Topics Concern   . Not on file  Social History Narrative  . Not on file   Social Determinants of Health   Financial Resource Strain: Not on file  Food Insecurity: Not on file  Transportation Needs: Not on file  Physical Activity: Not on file  Stress: Not on file  Social Connections: Not on file  Intimate Partner Violence: Not on file    Allergies:  Allergies  Allergen Reactions  . Penicillins Hives  . Tape Rash    Current Medications: Current Outpatient Medications  Medication Sig Dispense Refill  . aspirin 81 MG EC tablet Take 81 mg by mouth daily.    Marland Kitchen atorvastatin (LIPITOR) 20 MG tablet Take 20 mg by mouth daily.    . Cholecalciferol (VITAMIN D) 50 MCG (2000 UT) tablet Take 2,000 Units by mouth daily.    . cyanocobalamin 100 MCG tablet Take 100 mcg by mouth daily.    . cyclobenzaprine (FLEXERIL) 5 MG tablet Take 2 tablet (10 MG) at bedtime as needed.  May cause drowsiness so it is recommended to only take this at night.    Marland Kitchen HYDROcodone-acetaminophen (NORCO) 5-325 MG tablet Take 1 tablet by mouth every 6 (six) hours as needed for moderate pain. 15 tablet 0  . ibuprofen (ADVIL,MOTRIN) 800 MG tablet Take 1 tablet (800 mg total) by mouth every 8 (eight) hours as needed for moderate pain. 15 tablet 0  . Multiple Vitamin (MULTI-VITAMINS) TABS Take by mouth every morning.     . naproxen sodium (ALEVE) 220 MG tablet Take 220 mg by mouth 2 (two) times daily as needed.     . pantoprazole (PROTONIX) 40 MG tablet Take 40 mg by mouth daily.      No current facility-administered medications for this visit.     Physical Exam: Blood pressure 118/67, pulse (!) 55, temperature 98.1 F (36.7 C), resp. rate 18, weight 190 lb 9.6 oz (86.5 kg), last menstrual period 03/02/2011.  Physical Exam Constitutional:      General: She is not in acute distress.    Appearance: She is not diaphoretic.  HENT:     Head: Normocephalic and atraumatic.     Nose: Nose normal.     Mouth/Throat:     Pharynx: No  oropharyngeal exudate.  Eyes:     General: No scleral icterus.    Pupils: Pupils are equal, round, and reactive to light.  Cardiovascular:     Rate and Rhythm: Normal rate and regular rhythm.     Heart sounds: No murmur heard.   Pulmonary:     Effort: Pulmonary effort is normal. No respiratory distress.     Breath sounds: No rales.  Chest:     Chest wall: No tenderness.  Abdominal:     General: There is no distension.     Palpations: Abdomen is soft.     Tenderness: There is no abdominal tenderness.  Musculoskeletal:        General: Normal range of motion.     Cervical back: Normal range of motion and  neck supple.  Skin:    General: Skin is warm and dry.     Findings: No erythema.  Neurological:     Mental Status: She is alert and oriented to person, place, and time.     Cranial Nerves: No cranial nerve deficit.     Motor: No abnormal muscle tone.     Coordination: Coordination normal.  Psychiatric:        Mood and Affect: Affect normal.      Appointment on 01/13/2021  Component Date Value Ref Range Status  . Sodium 01/13/2021 141  135 - 145 mmol/L Final  . Potassium 01/13/2021 4.7  3.5 - 5.1 mmol/L Final  . Chloride 01/13/2021 106  98 - 111 mmol/L Final  . CO2 01/13/2021 26  22 - 32 mmol/L Final  . Glucose, Bld 01/13/2021 96  70 - 99 mg/dL Final   Glucose reference range applies only to samples taken after fasting for at least 8 hours.  . BUN 01/13/2021 14  6 - 20 mg/dL Final  . Creatinine, Ser 01/13/2021 0.62  0.44 - 1.00 mg/dL Final  . Calcium 01/13/2021 9.0  8.9 - 10.3 mg/dL Final  . Total Protein 01/13/2021 6.9  6.5 - 8.1 g/dL Final  . Albumin 01/13/2021 4.0  3.5 - 5.0 g/dL Final  . AST 01/13/2021 18  15 - 41 U/L Final  . ALT 01/13/2021 17  0 - 44 U/L Final  . Alkaline Phosphatase 01/13/2021 64  38 - 126 U/L Final  . Total Bilirubin 01/13/2021 0.7  0.3 - 1.2 mg/dL Final  . GFR, Estimated 01/13/2021 >60  >60 mL/min Final   Comment: (NOTE) Calculated using the  CKD-EPI Creatinine Equation (2021)   . Anion gap 01/13/2021 9  5 - 15 Final   Performed at [facility], [address] [place], [place] [address]  . WBC 01/13/2021 6.0  4.0 - 10.5 K/uL Final  . RBC 01/13/2021 3.94  3.87 - 5.11 MIL/uL Final  . Hemoglobin 01/13/2021 12.1  12.0 - 15.0 g/dL Final  . HCT 01/13/2021 35.9* 36.0 - 46.0 % Final  . MCV 01/13/2021 91.1  80.0 - 100.0 fL Final  . MCH 01/13/2021 30.7  26.0 - 34.0 pg Final  . MCHC 01/13/2021 33.7  30.0 - 36.0 g/dL Final  . RDW 01/13/2021 12.2  11.5 - 15.5 % Final  . Platelets 01/13/2021 214  150 - 400 K/uL Final  . nRBC 01/13/2021 0.0  0.0 - 0.2 % Final  . Neutrophils Relative % 01/13/2021 52  % Final  . Neutro Abs 01/13/2021 3.1  1.7 - 7.7 K/uL Final  . Lymphocytes Relative 01/13/2021 37  % Final  . Lymphs Abs 01/13/2021 2.3  0.7 - 4.0 K/uL Final  . Monocytes Relative 01/13/2021 8  % Final  . Monocytes Absolute 01/13/2021 0.5  0.1 - 1.0 K/uL Final  . Eosinophils Relative 01/13/2021 2  % Final  . Eosinophils Absolute 01/13/2021 0.1  0.0 - 0.5 K/uL Final  . Basophils Relative 01/13/2021 1  % Final  . Basophils Absolute 01/13/2021 0.1  0.0 - 0.1 K/uL Final  . Immature Granulocytes 01/13/2021 0  % Final  . Abs Immature Granulocytes 01/13/2021 0.02  0.00 - 0.07 K/uL Final   Performed at [facility], [address], [place], [place] [address]    Assessment:  Heather Zamora is a 55 y.o. female with history of stage IA carcinoid presents for follow-up. 1. Carcinoid tumor of right lung   2. Diarrhea,  unspecified type    #History of right lung carcinoid tumor  Patient is clinically doing very well 12/09/2020 lung cancer screening program CT chest was independently reviewed by me and discussed with patient. Lung RADS 2S. Continue follow-up with lung cancer screening for repeat CT scan in a year.  #Diarrhea, etiology unknown.  Chromogranin A and urine 5 HIAA levels are pending.  I doubt this is due to her history of  lung carcinoid tumor as lung carcinoid tumors are less likely to cause carcinoid syndrome comparing to GI carcinoid tumors. Rule out other etiologies.  Patient informs me that she has discussed with her primary care provider and was recommended to observe her symptoms.   Follow up in 1 year. Cbc, cmp, chromogranin A level, 5 HIAA level. Orders Placed This Encounter  Procedures  . 5 HIAA, quantitative, urine, 24 hour    Standing Status:   Future    Standing Expiration Date:   01/13/2022  . CBC with Differential/Platelet    Standing Status:   Future    Standing Expiration Date:   01/13/2022  . Comprehensive metabolic panel    Standing Status:   Future    Standing Expiration Date:   01/13/2022  . Chromogranin A    Standing Status:   Future    Standing Expiration Date:   01/13/2022    We spent sufficient time to discuss many aspect of care, questions were answered to patient's satisfaction.    Earlie Server, MD, PhD Hematology Oncology [facility] Pager- [phone] 01/13/2021

## 2021-01-15 LAB — CHROMOGRANIN A: Chromogranin A (ng/mL): 42.4 ng/mL (ref 0.0–101.8)

## 2021-01-16 ENCOUNTER — Other Ambulatory Visit: Payer: Self-pay

## 2021-01-16 ENCOUNTER — Ambulatory Visit: Payer: Self-pay | Admitting: Oncology

## 2021-04-03 ENCOUNTER — Other Ambulatory Visit: Payer: Self-pay

## 2021-04-03 ENCOUNTER — Emergency Department
Admission: EM | Admit: 2021-04-03 | Discharge: 2021-04-03 | Disposition: A | Discharge status: Home / Self Care | Payer: Self-pay | Attending: Emergency Medicine | Admitting: Emergency Medicine

## 2021-04-03 DIAGNOSIS — M461 Sacroiliitis, not elsewhere classified: Secondary | ICD-10-CM | POA: Insufficient documentation

## 2021-04-03 DIAGNOSIS — M5441 Lumbago with sciatica, right side: Secondary | ICD-10-CM | POA: Insufficient documentation

## 2021-04-03 DIAGNOSIS — M5431 Sciatica, right side: Secondary | ICD-10-CM

## 2021-04-03 DIAGNOSIS — Z85110 Personal history of malignant carcinoid tumor of bronchus and lung: Secondary | ICD-10-CM | POA: Insufficient documentation

## 2021-04-03 DIAGNOSIS — J449 Chronic obstructive pulmonary disease, unspecified: Secondary | ICD-10-CM | POA: Insufficient documentation

## 2021-04-03 DIAGNOSIS — G8929 Other chronic pain: Secondary | ICD-10-CM | POA: Insufficient documentation

## 2021-04-03 DIAGNOSIS — I25118 Atherosclerotic heart disease of native coronary artery with other forms of angina pectoris: Secondary | ICD-10-CM | POA: Insufficient documentation

## 2021-04-03 DIAGNOSIS — Z79899 Other long term (current) drug therapy: Secondary | ICD-10-CM | POA: Insufficient documentation

## 2021-04-03 DIAGNOSIS — Z7982 Long term (current) use of aspirin: Secondary | ICD-10-CM | POA: Insufficient documentation

## 2021-04-03 DIAGNOSIS — Z87891 Personal history of nicotine dependence: Secondary | ICD-10-CM | POA: Insufficient documentation

## 2021-04-03 MED ORDER — OXYCODONE-ACETAMINOPHEN 5-325 MG PO TABS: 1.0000 | ORAL_TABLET | ORAL | 0 refills | Status: AC | PRN

## 2021-04-03 MED ORDER — PREDNISONE 10 MG PO TABS: 10.0000 mg | ORAL_TABLET | Freq: Every day | ORAL | 0 refills | Status: DC

## 2021-04-03 MED ORDER — PREDNISONE 20 MG PO TABS
60.0000 mg | ORAL_TABLET | Freq: Once | ORAL | Status: AC
  Administered 2021-04-03: 60 mg via ORAL
  Filled 2021-04-03: qty 3

## 2021-04-03 MED ORDER — OXYCODONE-ACETAMINOPHEN 5-325 MG PO TABS
1.0000 | ORAL_TABLET | Freq: Once | ORAL | Status: AC
  Administered 2021-04-03: 1 via ORAL
  Filled 2021-04-03: qty 1

## 2021-04-03 NOTE — ED Triage Notes (Signed)
Pt c/o lower back pain that radiates into the right leg since yesterday

## 2021-04-03 NOTE — ED Notes (Signed)
Pt c/o right lower back pain that radiates into right hip and leg and is made worse with movement. Pt states she has taken tylenol for chronic back pain but did not have with relief this morning after taking tylenol around 7 am. Pt is AOX4, NAD noted. Pt denies ever having CT or diagnosis.

## 2021-04-03 NOTE — ED Provider Notes (Signed)
[facility] Emergency Department Provider Note  Time seen: 12:06 PM  I have reviewed the triage vital signs and the nursing notes.   HISTORY  Chief Complaint Back Pain   HPI Heather Zamora is a 55 y.o. female with a past medical history of COPD, pulmonary hypertension, hyperlipidemia, chronic back pain, presents to the emergency department for back and right leg pain.  According to the patient she has a history of back pain due to degenerative disc disease.  Patient states overnight she developed pain in her right lower back and down into her right buttock and leg at times.  Patient denies any trauma denies any known exacerbating factor but states a history of back pain in the past.  Patient denies any fever.  Denies any bladder or bowel incontinence.  Has been ambulatory but states pain with ambulation.   Past Medical History:  Diagnosis Date  . Cancer [facility]) Right Lung   2013  . COPD (chronic obstructive pulmonary disease) ([place])   . Pulmonary hypertension [facility])    doctor gave okay for procedure today    Patient Active Problem List   Diagnosis Date Noted  . Cardiac syncope 05/18/2018  . Coronary artery disease of native artery of native heart with stable angina pectoris ([place]) 05/18/2018  . Hyperlipidemia, mixed 05/18/2018  . Belching 03/28/2018  . Chronic heartburn 03/28/2018  . Hx of adenomatous colonic polyps 03/28/2018  . Other chest pain 03/28/2018  . DDD (degenerative disc disease), cervical 01/09/2018  . Joint pain 01/09/2018  . Rheumatoid factor positive 01/09/2018  . 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 BREAST BIOPSY Right 03/04/2016   benign  . CHOLECYSTECTOMY    . cystectomy ovary    . ovarian cyst removed      Prior to Admission medications   Medication Sig Start Date End Date Taking? Authorizing Provider  aspirin 81 MG EC tablet Take 81 mg by mouth daily.    [provider]  atorvastatin (LIPITOR) 20 MG tablet Take 20 mg by mouth daily. 12/15/20   [provider]  Cholecalciferol (VITAMIN D) 50 MCG (2000 UT) tablet Take 2,000 Units by mouth daily.    [provider]  cyanocobalamin 100 MCG tablet Take 100 mcg by mouth daily.    [provider]  cyclobenzaprine (FLEXERIL) 5 MG tablet Take 2 tablet (10 MG) at bedtime as needed.  May cause drowsiness so it is recommended to only take this at night. 01/23/18   [provider]  HYDROcodone-acetaminophen (NORCO) 5-325 MG tablet Take 1 tablet by mouth every 6 (six) hours as needed for moderate pain. 11/25/18   Paulette Blanch, MD  ibuprofen (ADVIL,MOTRIN) 800 MG tablet Take 1 tablet (800 mg total) by mouth every 8 (eight) hours as needed for moderate pain. 11/25/18   Paulette Blanch, MD  Multiple Vitamin (MULTI-VITAMINS) TABS Take by mouth every morning.     [provider]  naproxen sodium (ALEVE) 220 MG tablet Take 220 mg by mouth 2 (two) times daily as needed.     [provider]  pantoprazole (PROTONIX) 40 MG tablet Take 40 mg by mouth daily.     [provider]    Allergies  Allergen Reactions  . Penicillins Hives  . Tape Rash    Family History  Problem Relation Age of Onset  . Breast cancer Maternal Aunt        40's  Social History Social History   Tobacco Use  . Smoking status: Former Smoker    Packs/day: 2.00    Years: 32.00    Pack years: 64.00    Types: Cigarettes    Quit date: 03/2012    Years since quitting: 9.0  . Smokeless tobacco: Never Used  Vaping Use  . Vaping Use: Never used  Substance Use Topics  . Alcohol use: No  . Drug use: No    Review of Systems Constitutional: Negative for fever. Cardiovascular: Negative for chest pain. Respiratory: Negative for shortness of breath. Gastrointestinal: Negative for abdominal pain Musculoskeletal: Lower back pain/right leg pain Neurological: Negative for headache All  other ROS negative  ____________________________________________   PHYSICAL EXAM:  VITAL SIGNS: ED Triage Vitals  Enc Vitals Group     BP 04/03/21 1029 134/71     Pulse Rate 04/03/21 1029 69     Resp 04/03/21 1029 16     Temp 04/03/21 1029 97.9 F (36.6 C)     Temp Source 04/03/21 1029 Oral     SpO2 04/03/21 1029 100 %     Weight 04/03/21 1031 185 lb (83.9 kg)     Height 04/03/21 1031 5' (1.524 m)     Head Circumference --      Peak Flow --      Pain Score 04/03/21 1030 10     Pain Loc --      Pain Edu? --      Excl. in [place]? --     Constitutional: Alert and oriented. Well appearing and in no distress. Eyes: Normal exam ENT      Head: Normocephalic and atraumatic.      Mouth/Throat: Mucous membranes are moist. Cardiovascular: Normal rate, regular rhythm. Respiratory: Normal respiratory effort without tachypnea nor retractions. Breath sounds are clear  Gastrointestinal: Soft and nontender. No distention.   Musculoskeletal: Moderate tenderness over the right SI joint.  No midline tenderness. Neurologic:  Normal speech and language. No gross focal neurologic deficits  Skin:  Skin is warm, dry and intact.  Psychiatric: Mood and affect are normal.   ____________________________________________   INITIAL IMPRESSION / ASSESSMENT AND PLAN / ED COURSE  Pertinent labs & imaging results that were available during my care of the patient were reviewed by me and considered in my medical decision making (see chart for details).   Patient presents to the emergency department for lower back pain and right SI joint pain radiating into the right buttock at times.  Symptoms are very suggestive of sacroiliitis for sciatica.  No trauma.  No concern for fracture.  No bladder or bowel incontinence.  We will treat the patient with a taper of prednisone, and a short course of pain medication.  Patient agreeable to plan of care.  Heather Zamora was evaluated in Emergency Department on  04/03/2021 for the symptoms described in the history of present illness. She was evaluated in the context of the global COVID-19 pandemic, which necessitated consideration that the patient might be at risk for infection with the SARS-CoV-2 virus that causes COVID-19. Institutional protocols and algorithms that pertain to the evaluation of patients at risk for COVID-19 are in a state of rapid change based on information released by regulatory bodies including the CDC and federal and state organizations. These policies and algorithms were followed during the patient's care in the ED.  ____________________________________________   FINAL CLINICAL IMPRESSION(S) / ED DIAGNOSES  Sciatica Sacroiliitis   Harvest Dark, MD 04/03/21 1208

## 2021-11-30 ENCOUNTER — Other Ambulatory Visit: Payer: Self-pay | Admitting: Family Medicine

## 2021-11-30 DIAGNOSIS — Z1231 Encounter for screening mammogram for malignant neoplasm of breast: Secondary | ICD-10-CM

## 2021-12-31 ENCOUNTER — Ambulatory Visit
Admission: RE | Admit: 2021-12-31 | Discharge: 2021-12-31 | Disposition: A | Discharge status: Home / Self Care | Payer: Self-pay | Source: Ambulatory Visit | Attending: Family Medicine | Admitting: Family Medicine

## 2021-12-31 ENCOUNTER — Other Ambulatory Visit: Payer: Self-pay

## 2021-12-31 DIAGNOSIS — Z1231 Encounter for screening mammogram for malignant neoplasm of breast: Secondary | ICD-10-CM | POA: Insufficient documentation

## 2022-01-07 ENCOUNTER — Other Ambulatory Visit: Payer: Self-pay | Admitting: *Deleted

## 2022-01-07 ENCOUNTER — Telehealth: Payer: Self-pay | Admitting: Acute Care

## 2022-01-07 DIAGNOSIS — Z87891 Personal history of nicotine dependence: Secondary | ICD-10-CM

## 2022-01-07 NOTE — Telephone Encounter (Signed)
Attempted to contact pt to schedule lung screening CT scan. Voicemail full and unable to leave a message. Will call back.  ?

## 2022-01-14 ENCOUNTER — Inpatient Hospital Stay: Payer: Self-pay | Attending: Oncology

## 2022-01-18 ENCOUNTER — Other Ambulatory Visit: Payer: Self-pay

## 2022-01-18 ENCOUNTER — Ambulatory Visit
Admission: RE | Admit: 2022-01-18 | Discharge: 2022-01-18 | Disposition: A | Discharge status: Home / Self Care | Payer: Self-pay | Source: Ambulatory Visit | Attending: Acute Care | Admitting: Acute Care

## 2022-01-18 DIAGNOSIS — Z87891 Personal history of nicotine dependence: Secondary | ICD-10-CM | POA: Insufficient documentation

## 2022-01-19 ENCOUNTER — Other Ambulatory Visit: Payer: Self-pay

## 2022-01-19 DIAGNOSIS — Z87891 Personal history of nicotine dependence: Secondary | ICD-10-CM

## 2022-01-21 ENCOUNTER — Ambulatory Visit: Payer: Self-pay | Admitting: Oncology

## 2022-01-26 ENCOUNTER — Encounter: Payer: Self-pay | Admitting: Oncology

## 2022-01-26 ENCOUNTER — Inpatient Hospital Stay: Payer: Self-pay | Admitting: Oncology

## 2023-01-19 ENCOUNTER — Ambulatory Visit
Admission: RE | Admit: 2023-01-19 | Discharge: 2023-01-19 | Disposition: A | Discharge status: Home / Self Care | Payer: Self-pay | Source: Ambulatory Visit | Attending: Acute Care | Admitting: Acute Care

## 2023-01-19 DIAGNOSIS — Z87891 Personal history of nicotine dependence: Secondary | ICD-10-CM | POA: Insufficient documentation

## 2023-01-19 DIAGNOSIS — I700 Atherosclerosis of aorta: Secondary | ICD-10-CM | POA: Insufficient documentation

## 2023-01-19 DIAGNOSIS — J439 Emphysema, unspecified: Secondary | ICD-10-CM | POA: Insufficient documentation

## 2023-01-19 DIAGNOSIS — Z122 Encounter for screening for malignant neoplasm of respiratory organs: Secondary | ICD-10-CM | POA: Insufficient documentation

## 2023-01-31 ENCOUNTER — Encounter: Payer: Self-pay | Admitting: Family Medicine

## 2023-01-31 DIAGNOSIS — Z1231 Encounter for screening mammogram for malignant neoplasm of breast: Secondary | ICD-10-CM

## 2023-02-15 ENCOUNTER — Other Ambulatory Visit: Payer: Self-pay

## 2023-02-15 DIAGNOSIS — Z1231 Encounter for screening mammogram for malignant neoplasm of breast: Secondary | ICD-10-CM

## 2023-02-21 ENCOUNTER — Other Ambulatory Visit: Payer: Self-pay

## 2023-02-21 ENCOUNTER — Ambulatory Visit: Payer: Self-pay | Attending: Hematology and Oncology | Admitting: Hematology and Oncology

## 2023-02-21 ENCOUNTER — Ambulatory Visit
Admission: RE | Admit: 2023-02-21 | Discharge: 2023-02-21 | Disposition: A | Discharge status: Home / Self Care | Payer: Self-pay | Source: Ambulatory Visit | Attending: Obstetrics and Gynecology | Admitting: Obstetrics and Gynecology

## 2023-02-21 VITALS — BP 123/71 | Wt 192.2 lb

## 2023-02-21 DIAGNOSIS — Z01419 Encounter for gynecological examination (general) (routine) without abnormal findings: Secondary | ICD-10-CM

## 2023-02-21 DIAGNOSIS — Z1211 Encounter for screening for malignant neoplasm of colon: Secondary | ICD-10-CM

## 2023-02-21 DIAGNOSIS — Z1231 Encounter for screening mammogram for malignant neoplasm of breast: Secondary | ICD-10-CM

## 2023-02-21 NOTE — Progress Notes (Signed)
Ms. Heather Zamora is a 57 y.o. No obstetric history on file. female who presents to [facility] clinic today with no complaints.    Pap Smear: Pap smear completed today. Last Pap smear was 2019 at [facility] clinic and was normal. Per patient has no history of an abnormal Pap smear. Last Pap smear result is available in Epic.   Physical exam: Breasts Breasts symmetrical. No skin abnormalities bilateral breasts. No nipple retraction bilateral breasts. No nipple discharge bilateral breasts. No lymphadenopathy. No lumps palpated bilateral breasts.  MM 3D SCREEN BREAST BILATERAL  Result Date: 12/31/2021 CLINICAL DATA:  Screening. EXAM: DIGITAL SCREENING BILATERAL MAMMOGRAM WITH TOMOSYNTHESIS AND CAD TECHNIQUE: Bilateral screening digital craniocaudal and mediolateral oblique mammograms were obtained. Bilateral screening digital breast tomosynthesis was performed. The images were evaluated with computer-aided detection. COMPARISON:  Previous exam(s). ACR Breast Density Category a: The breast tissue is almost entirely fatty. FINDINGS: There are no findings suspicious for malignancy. IMPRESSION: No mammographic evidence of malignancy. A result letter of this screening mammogram will be mailed directly to the patient. RECOMMENDATION: Screening mammogram in one year. (Code:SM-B-01Y) BI-RADS CATEGORY  1: Negative. Electronically Signed   By: Frederico Hamman M.D.   On: 12/31/2021 14:30  MM DIAG BREAST TOMO BILATERAL  Result Date: 03/17/2018 CLINICAL DATA:  The patient feels as if she has new subtle dimpling along the right side of her areola causing mild deviation of the nipple to the right. Per the patient, these are new physical exam findings. EXAM: DIGITAL DIAGNOSTIC BILATERAL MAMMOGRAM WITH CAD AND TOMO ULTRASOUND RIGHT BREAST COMPARISON:  Previous exam(s). ACR Breast Density Category b: There are scattered areas of fibroglandular density. FINDINGS: No suspicious masses, calcifications, or changes.  Mammographic images were processed with CAD. On physical exam, the findings described by the patient or very subtle. Targeted ultrasound is performed, showing no sonographic abnormalities. IMPRESSION: The cause for the patient's subtle new physical exam findings of areolar dimpling and nipple deviation is not identified mammographically or sonographically. No mammographic or sonographic evidence of malignancy. RECOMMENDATION: Recommend breast MRI for further assessment of the patient's symptoms. Recommend annual screening mammography. I have discussed the findings and recommendations with the patient. Results were also provided in writing at the conclusion of the visit. If applicable, a reminder letter will be sent to the patient regarding the next appointment. BI-RADS CATEGORY  2: Benign. Electronically Signed   By: Gerome Sam III M.D   On: 03/17/2018 10:55         Pelvic/Bimanual Ext Genitalia No lesions, no swelling and no discharge observed on external genitalia.        Vagina Vagina pink and normal texture. No lesions or discharge observed in vagina.        Cervix Cervix is present. Cervix pink and of normal texture. No discharge observed.    Uterus Uterus is present and palpable. Uterus in normal position and normal size.        Adnexae Bilateral ovaries present and palpable. No tenderness on palpation.         Rectovaginal No rectal exam completed today since patient had no rectal complaints. No skin abnormalities observed on exam.     Smoking History: Patient has never smoked and was not referred to quit line.    Patient Navigation: Patient education provided. Access to services provided for patient through [facility] program. Heather Zamora interpreter provided. No transportation provided   Colorectal Cancer Screening: Per patient has never had colonoscopy completed No complaints today. FIT test given.  Breast and Cervical Cancer Risk Assessment: Patient does not have  family history of breast cancer, known genetic mutations, or radiation treatment to the chest before age 36. Patient does not have history of cervical dysplasia, immunocompromised, or DES exposure in-utero.  Risk Scores as of 02/21/2023     Heather Zamora           5-year 1.08 %   Lifetime 5.87 %            Last calculated by Heather Rutherford, LPN on 5/36/1443 at  2:47 PM        A: [facility] exam with pap smear No complaints with benign exam.   P: [facility]. Appointment scheduled 02/21/23.  Pascal Lux, NP 02/21/2023 2:59 PM

## 2023-02-21 NOTE — Patient Instructions (Addendum)
Taught Earle Gell about self breast awareness and gave educational materials to take home. Patient did not need a Pap smear today due to last Pap smear was in 2019 per patient.  Let her know [facility] will cover Pap smears every 5 years unless has a history of abnormal Pap smears. [facility]. Appointment scheduled for 02/21/23. Patient aware of appointment and will be there. Let patient know will follow up with her within the next couple weeks with results. Earle Gell verbalized understanding.  Pascal Lux, NP 2:29 PM

## 2023-02-23 ENCOUNTER — Other Ambulatory Visit: Payer: Self-pay | Admitting: *Deleted

## 2023-02-23 DIAGNOSIS — Z87891 Personal history of nicotine dependence: Secondary | ICD-10-CM

## 2023-02-23 LAB — CYTOLOGY - PAP
Adequacy: ABSENT
Comment: NEGATIVE
Diagnosis: NEGATIVE
High risk HPV: NEGATIVE

## 2023-11-11 ENCOUNTER — Emergency Department
Admission: EM | Admit: 2023-11-11 | Discharge: 2023-11-11 | Disposition: A | Discharge status: Home / Self Care | Payer: Medicaid Other | Attending: Emergency Medicine | Admitting: Emergency Medicine

## 2023-11-11 ENCOUNTER — Other Ambulatory Visit: Payer: Self-pay

## 2023-11-11 ENCOUNTER — Emergency Department: Payer: Medicaid Other

## 2023-11-11 DIAGNOSIS — M7989 Other specified soft tissue disorders: Secondary | ICD-10-CM

## 2023-11-11 DIAGNOSIS — M79605 Pain in left leg: Secondary | ICD-10-CM | POA: Insufficient documentation

## 2023-11-11 NOTE — ED Triage Notes (Signed)
 Pt comes with c/o left leg pain that started Tuesday. Pt states some swelling and purple spot.

## 2023-11-11 NOTE — Discharge Instructions (Signed)
 Your ultrasound was negative today.  You did not wish to have any further imaging or labs today.  Please follow-up with your outpatient provider.  Please return for any new, worsening, or change in symptoms or other concerns.  It was a pleasure caring for you today.

## 2023-11-11 NOTE — ED Provider Notes (Signed)
 [facility] Provider Note    Event Date/Time   First MD Initiated Contact with Patient 11/11/23 1555     (approximate)   History   Leg Pain   HPI  Heather Zamora is a 58 y.o. female who presents today for evaluation of left leg pain.  Patient wants to be checked for a blood clot.  She reports that her pain is in the front of her knee and also she feels a tightness in the back of her leg.  She is unsure how long she has felt the symptoms for her.  She denies any injuries.  She still able to ambulate.  She denies paresthesias.  She denies weakness.  She has not noticed any skin discoloration.  No chest pain or shortness of breath.  She also noticed a bruise to her inner lower leg so she is worried about blood clots.  She denies any easy bruising elsewhere.  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          Physical Exam   Triage Vital Signs: ED Triage Vitals  Encounter Vitals Group     BP 11/11/23 1302 139/83     Systolic BP Percentile --      Diastolic BP Percentile --      Pulse Rate 11/11/23 1302 88     Resp 11/11/23 1302 18     Temp 11/11/23 1302 98 F (36.7 C)     Temp src --      SpO2 11/11/23 1302 98 %     Weight 11/11/23 1303 180 lb (81.6 kg)     Height 11/11/23 1303 5' (1.524 m)     Head Circumference --      Peak Flow --      Pain Score 11/11/23 1303 8     Pain Loc --      Pain Education --      Exclude from Growth Chart --     Most recent vital  signs: Vitals:   11/11/23 1302  BP: 139/83  Pulse: 88  Resp: 18  Temp: 98 F (36.7 C)  SpO2: 98%    Physical Exam Vitals and nursing note reviewed.  Constitutional:      General: Awake and alert. No acute distress.    Appearance: Normal appearance. The patient is obese.  HENT:     Head: Normocephalic and atraumatic.     Mouth: Mucous membranes are moist.  Eyes:     General: PERRL. Normal EOMs        Right eye: No discharge.        Left eye: No discharge.     Conjunctiva/sclera: Conjunctivae normal.  Cardiovascular:     Rate and Rhythm: Normal rate and regular rhythm.     Pulses: Normal pulses.     Heart sounds: Normal heart sounds Pulmonary:     Effort: Pulmonary effort is normal. No respiratory distress.     Breath sounds: Normal breath sounds.  Abdominal:     Abdomen is soft.  There is no abdominal tenderness. No rebound or guarding. No distention. Musculoskeletal:        General: No swelling. Normal range of motion.     Cervical back: Normal range of motion and neck supple.  No pitting edema to bilateral lower extremities.  There is a 1 x 1 cm faint area of ecchymosis to her medial lower leg that is nontender to palpation.  She has full and normal range of motion of her hip, knee, ankle.  No warmth or erythema to her joints.  She has normal distal pulses, sensation is intact to light touch throughout.  She is ambulatory with a steady gait.  Normal capillary refill. Skin:    General: Skin is warm and dry.     Capillary Refill: Capillary refill takes less than 2 seconds.     Findings: No rash.  Neurological:     Mental Status: The patient is awake and alert.      ED Results / Procedures / Treatments   Labs (all labs ordered are listed, but only abnormal results are displayed) Labs Reviewed - No data to display   EKG     RADIOLOGY I independently reviewed and interpreted imaging and agree with radiologists findings.     PROCEDURES:  Critical Care  performed:   Procedures   MEDICATIONS ORDERED IN ED: Medications - No data to display   IMPRESSION / MDM / ASSESSMENT AND PLAN / ED COURSE  I reviewed the triage vital signs and the nursing notes.   Differential diagnosis includes, but is not limited to, DVT, dependent edema, knee effusion, osteoarthritis.  Patient is awake and alert, hemodynamically stable and afebrile.  She is nontoxic in appearance.  There is no obvious pitting edema to her lower extremities.  She has full normal range of motion of all joints.  Ultrasound obtained in triage is negative for DVT.  Patient is reassured by these results.  We discussed other possibilities of her leg swelling and recommended further workup, however patient declined.  Does not want any further workup done today.  There is no erythema to her knee, no warmth, she has full and normal range of motion, no fever, do not suspect septic joint.  No crackles/rales on exam, no increased work of breathing, no JVD, does not appear to be volume overloaded, do not suspect new onset heart failure as a etiology of her symptoms.  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  We discussed symptomatic management and return precautions.  Patient understands and agrees with plan.  She was discharged in stable condition.   Patient's presentation is most consistent with acute complicated illness / injury requiring diagnostic workup.    FINAL CLINICAL IMPRESSION(S) / ED DIAGNOSES   Final diagnoses:  Left leg pain     Rx / DC Orders   ED Discharge Orders     None        Note:  This document was prepared using Dragon voice recognition software and may include unintentional dictation errors.   Deondra Labrador E, PA-C 11/11/23 1630    Suzanne Kirsch, MD 11/12/23 [phone]

## 2024-01-20 ENCOUNTER — Ambulatory Visit
Admission: RE | Admit: 2024-01-20 | Discharge: 2024-01-20 | Disposition: A | Discharge status: Home / Self Care | Payer: Medicaid Other | Source: Ambulatory Visit | Attending: Diagnostic Radiology | Admitting: Diagnostic Radiology

## 2024-01-20 DIAGNOSIS — Z87891 Personal history of nicotine dependence: Secondary | ICD-10-CM | POA: Diagnosis present

## 2024-01-20 DIAGNOSIS — Z122 Encounter for screening for malignant neoplasm of respiratory organs: Secondary | ICD-10-CM | POA: Insufficient documentation

## 2024-01-20 DIAGNOSIS — I700 Atherosclerosis of aorta: Secondary | ICD-10-CM | POA: Diagnosis not present

## 2024-01-20 DIAGNOSIS — J439 Emphysema, unspecified: Secondary | ICD-10-CM | POA: Diagnosis not present

## 2024-01-20 DIAGNOSIS — I2721 Secondary pulmonary arterial hypertension: Secondary | ICD-10-CM | POA: Diagnosis not present

## 2024-02-27 ENCOUNTER — Other Ambulatory Visit: Payer: Self-pay

## 2024-02-27 DIAGNOSIS — Z122 Encounter for screening for malignant neoplasm of respiratory organs: Secondary | ICD-10-CM

## 2024-02-27 DIAGNOSIS — Z87891 Personal history of nicotine dependence: Secondary | ICD-10-CM

## 2024-03-11 ENCOUNTER — Emergency Department
Admission: EM | Admit: 2024-03-11 | Discharge: 2024-03-11 | Disposition: A | Discharge status: Home / Self Care | Attending: Emergency Medicine | Admitting: Emergency Medicine

## 2024-03-11 ENCOUNTER — Other Ambulatory Visit: Payer: Self-pay

## 2024-03-11 ENCOUNTER — Emergency Department

## 2024-03-11 DIAGNOSIS — J439 Emphysema, unspecified: Secondary | ICD-10-CM | POA: Insufficient documentation

## 2024-03-11 DIAGNOSIS — R0789 Other chest pain: Secondary | ICD-10-CM

## 2024-03-11 DIAGNOSIS — Z85118 Personal history of other malignant neoplasm of bronchus and lung: Secondary | ICD-10-CM | POA: Insufficient documentation

## 2024-03-11 DIAGNOSIS — R0781 Pleurodynia: Secondary | ICD-10-CM | POA: Diagnosis present

## 2024-03-11 LAB — HEPATIC FUNCTION PANEL
ALT: 12 U/L (ref 0–44)
AST: 15 U/L (ref 15–41)
Albumin: 3.9 g/dL (ref 3.5–5.0)
Alkaline Phosphatase: 76 U/L (ref 38–126)
Bilirubin, Direct: <0.1 mg/dL (ref 0.0–0.2)
Total Bilirubin: 0.9 mg/dL (ref 0.0–1.2)
Total Protein: 6.8 g/dL (ref 6.5–8.1)

## 2024-03-11 LAB — CBC
HCT: 37.2 % (ref 36.0–46.0)
Hemoglobin: 12.2 g/dL (ref 12.0–15.0)
MCH: 30.3 pg (ref 26.0–34.0)
MCHC: 32.8 g/dL (ref 30.0–36.0)
MCV: 92.5 fL (ref 80.0–100.0)
Platelets: 249 10*3/uL (ref 150–400)
RBC: 4.02 MIL/uL (ref 3.87–5.11)
RDW: 12.1 % (ref 11.5–15.5)
WBC: 5.7 10*3/uL (ref 4.0–10.5)
nRBC: 0 % (ref 0.0–0.2)

## 2024-03-11 LAB — BASIC METABOLIC PANEL WITH GFR
Anion gap: 8 (ref 5–15)
BUN: 11 mg/dL (ref 6–20)
CO2: 24 mmol/L (ref 22–32)
Calcium: 8.9 mg/dL (ref 8.9–10.3)
Chloride: 107 mmol/L (ref 98–111)
Creatinine, Ser: 0.75 mg/dL (ref 0.44–1.00)
GFR, Estimated: >60 mL/min (ref >60)
Glucose, Bld: 103 mg/dL — ABNORMAL HIGH (ref 70–99)
Potassium: 4 mmol/L (ref 3.5–5.1)
Sodium: 139 mmol/L (ref 135–145)

## 2024-03-11 LAB — D-DIMER, QUANTITATIVE: D-Dimer, Quant: 0.28 ug{FEU}/mL (ref 0.00–0.50)

## 2024-03-11 LAB — TROPONIN I (HIGH SENSITIVITY)
Troponin I (High Sensitivity): 9 ng/L (ref <18)
Troponin I (High Sensitivity): 9 ng/L (ref <18)

## 2024-03-11 LAB — LIPASE, BLOOD: Lipase: 36 U/L (ref 11–51)

## 2024-03-11 MED ORDER — ACETAMINOPHEN 500 MG PO TABS
1000.0000 mg | ORAL_TABLET | Freq: Once | ORAL | Status: AC
  Administered 2024-03-11: 1000 mg via ORAL
  Filled 2024-03-11: qty 2

## 2024-03-11 MED ORDER — LIDOCAINE 5 % EX PTCH
1.0000 | MEDICATED_PATCH | CUTANEOUS | Status: DC
  Administered 2024-03-11: 1 via TRANSDERMAL
  Filled 2024-03-11: qty 1

## 2024-03-11 MED ORDER — KETOROLAC TROMETHAMINE 15 MG/ML IJ SOLN
15.0000 mg | Freq: Once | INTRAMUSCULAR | Status: AC
  Administered 2024-03-11: 15 mg via INTRAVENOUS
  Filled 2024-03-11: qty 1

## 2024-03-11 MED ORDER — DOXYCYCLINE HYCLATE 100 MG PO TABS: 100.0000 mg | ORAL_TABLET | Freq: Two times a day (BID) | ORAL | 0 refills | Status: AC

## 2024-03-11 MED ORDER — CYCLOBENZAPRINE HCL 5 MG PO TABS: 5.0000 mg | ORAL_TABLET | Freq: Three times a day (TID) | ORAL | 0 refills | Status: AC | PRN

## 2024-03-11 MED ORDER — IBUPROFEN 600 MG PO TABS: 600.0000 mg | ORAL_TABLET | Freq: Four times a day (QID) | ORAL | 0 refills | Status: AC | PRN

## 2024-03-11 MED ORDER — PANTOPRAZOLE SODIUM 40 MG PO TBEC
40.0000 mg | DELAYED_RELEASE_TABLET | Freq: Every day | ORAL | 0 refills | Status: AC
Start: 2024-03-11 — End: 2024-03-21

## 2024-03-11 MED ORDER — PREDNISONE 20 MG PO TABS
40.0000 mg | ORAL_TABLET | Freq: Every day | ORAL | 0 refills | Status: AC
Start: 2024-03-11 — End: 2024-03-16

## 2024-03-11 NOTE — Discharge Instructions (Addendum)
 Take tylenol  1g every 8 hours.  Patient take the ibuprofen  and steroids with food to help with any inflammation.  I did put you on an acid reducers just to prevent your stomach from hurting from taking both of these medications.  We have also prescribed an antibiotic in case this could be related to an infection that starting.  You should follow-up with your primary care doctor and return to the ER if you develop worsening symptoms or any other concerns

## 2024-03-11 NOTE — ED Provider Notes (Addendum)
 [facility] Provider Note    Event Date/Time   First MD Initiated Contact with Patient 03/11/24 1226     (approximate)   History   Chest Pain   HPI  Heather Zamora is a 58 y.o. female with history of hyperlipidemia, prior lung cancer years ago who comes in with concerns for left rib pain, breast pain.  Patient reports pain started on Monday.  She reports it is been constant, worse when she takes a deep breath.  She states that she really has not had any new shortness of breath that is more of just pain when she takes a deep breath contrary to triage note.  She denies any risk factors for PE.  I reviewed the oncology note from 01/13/2021 where patient has a history of a right lung carcinoid tumor.  She gets yearly screening.  Her last CT scan was on 02/27/2024 with some benign nodules that recommended continue annual screening.  She did have emphysema noted.  She denies any fevers, cough or sick symptoms.  She has not take anything to help with the pain today.  She does report that it is better when she is not wearing her bra or when she lifts her breast up.  Physical Exam   Triage Vital Signs: ED Triage Vitals  Encounter Vitals Group     BP 03/11/24 1152 131/81     Systolic BP Percentile --      Diastolic BP Percentile --      Pulse Rate 03/11/24 1152 79     Resp 03/11/24 1152 19     Temp 03/11/24 1152 98 F (36.7 C)     Temp src --      SpO2 03/11/24 1152 99 %     Weight 03/11/24 1151 182 lb (82.6 kg)     Height 03/11/24 1151 5' (1.524 m)     Head Circumference --      Peak Flow --      Pain Score 03/11/24 1151 7     Pain Loc --      Pain Education --      Exclude from Growth Chart --     Most recent vital signs: Vitals:   03/11/24 1152  BP: 131/81  Pulse: 79  Resp: 19  Temp: 98 F (36.7 C)  SpO2: 99%     General: Awake, no distress.  CV:  Good peripheral perfusion.  No murmur Resp:  Normal effort.  Clear lungs Abd:  No distention.   Soft and nontender Other:  No swelling in legs.  No calf tenderness No rash noted.  Does have chest wall tenderness.  Pain worse with certain movements of her arm.  Good distal pulses in the arms and legs. Sensation intact throughout.   ED Results / Procedures / Treatments   Labs (all labs ordered are listed, but only abnormal results are displayed) Labs Reviewed  BASIC METABOLIC PANEL WITH GFR - Abnormal; Notable for the following components:      Result Value   Glucose, Bld 103 (*)    All other components within normal limits  CBC  HEPATIC FUNCTION PANEL  LIPASE, BLOOD  D-DIMER, QUANTITATIVE  TROPONIN I (HIGH SENSITIVITY)  TROPONIN I (HIGH SENSITIVITY)     EKG  My interpretation of EKG:  Normal sinus rate of 80 without any ST elevation or T wave inversions except for V3, normal intervals  RADIOLOGY I have reviewed the xray personally and interpreted patient has right midlung  scarring.  Patient has a history of carcinoid tumor which was resected so suspect that is the cause of this.   PROCEDURES:  Critical Care performed: No  Procedures   MEDICATIONS ORDERED IN ED: Medications  lidocaine (LIDODERM) 5 % 1 patch (1 patch Transdermal Patch Applied 03/11/24 1313)  ketorolac (TORADOL) 15 MG/ML injection 15 mg (15 mg Intravenous Given 03/11/24 1313)  acetaminophen  (TYLENOL ) tablet 1,000 mg (1,000 mg Oral Given 03/11/24 1313)     IMPRESSION / MDM / ASSESSMENT AND PLAN / ED COURSE  I reviewed the triage vital signs and the nursing notes.   Patient's presentation is most consistent with acute presentation with potential threat to life or bodily function.   Patient comes in with some pleuritic chest pain.  Abdomen soft and nontender but workup was done to evaluate for pancreatitis, choledocholithiasis, ACS, PE.  Doubt dissection as no murmur, good pulses no numbness, no tingling, no tearing pain is been ongoing for almost a week now.    We discussed CT PE versus D-dimer for  evaluation of pulmonary embolism.  Given patient is low to moderate risk if you consider patient's Wells criteria of PE equally likely her score would be at most 3 which would put her in the moderate category.  Although given her point tenderness on examination and her reporting that it feels better when she lifts of her breast and when she is not wearing her bra I suspect that this is more likely musculoskeletal.  D-dimer therefore could be reasonable place to start.  After discussion with patient she did prefer to start off with D-dimer.  She did have recent CT imaging that shows stable lung nodules just 2 months ago.  Lipase is normal BMP reassuring CBC reassuring troponin is negative hepatic function normal  Repeat evaluation patient reports no significant resolution of pain.  Offered additional pain medication but she is driving so declined.  Her repeat troponin is negative.  D-dimer is negative.  At this time I have very low suspicion for dissection, PE.  Her pain seems to be more musculoskeletal in nature worse with movement.  Given she does have emphysema I wonder if she also could have developing infection and we will start her on doxycycline , prednisone .  We discussed CT imaging even with negative D-dimer but at this time patient declined and stated that she will return if symptoms are worsening but she would like to try these other medications first.  She feels comfortable with this plan and will be discharged home.  She understands not to drive on the [place]  The patient is on the cardiac monitor to evaluate for evidence of arrhythmia and/or significant heart rate changes.      FINAL CLINICAL IMPRESSION(S) / ED DIAGNOSES   Final diagnoses:  Atypical chest pain  Pulmonary emphysema, unspecified emphysema type (HCC)     Rx / DC Orders   ED Discharge Orders          Ordered    doxycycline  (VIBRA -TABS) 100 MG tablet  2 times daily        03/11/24 1508    predniSONE  (DELTASONE )  20 MG tablet  Daily with breakfast        03/11/24 1508    ibuprofen  (ADVIL ) 600 MG tablet  Every 6 hours PRN        03/11/24 1508    pantoprazole (PROTONIX) 40 MG tablet  Daily        03/11/24 1508    cyclobenzaprine (FLEXERIL) 5 MG  tablet  3 times daily PRN        03/11/24 1508             Note:  This document was prepared using Dragon voice recognition software and may include unintentional dictation errors.   Lubertha Rush, MD 03/11/24 1511    Lubertha Rush, MD 03/11/24 [phone]

## 2024-03-11 NOTE — ED Triage Notes (Signed)
 Pt comes with left rib and breast pain that started on Monday. Pt states pain chest when she breaths. Pt states sob.

## 2024-03-12 ENCOUNTER — Encounter: Payer: Self-pay | Admitting: Family Medicine

## 2024-03-13 ENCOUNTER — Other Ambulatory Visit: Payer: Self-pay | Admitting: Family Medicine

## 2024-03-13 DIAGNOSIS — N644 Mastodynia: Secondary | ICD-10-CM

## 2024-03-15 ENCOUNTER — Encounter

## 2024-03-15 ENCOUNTER — Other Ambulatory Visit

## 2024-03-16 ENCOUNTER — Ambulatory Visit
Admission: RE | Admit: 2024-03-16 | Discharge: 2024-03-16 | Disposition: A | Discharge status: Home / Self Care | Source: Ambulatory Visit | Attending: Family Medicine | Admitting: Family Medicine

## 2024-03-16 DIAGNOSIS — N644 Mastodynia: Secondary | ICD-10-CM

## 2024-04-03 ENCOUNTER — Other Ambulatory Visit

## 2024-04-03 ENCOUNTER — Encounter

## 2024-05-21 ENCOUNTER — Other Ambulatory Visit: Payer: Self-pay

## 2024-05-21 ENCOUNTER — Emergency Department

## 2024-05-21 ENCOUNTER — Emergency Department: Admission: EM | Admit: 2024-05-21 | Discharge: 2024-05-22 | Disposition: A | Discharge status: Home / Self Care

## 2024-05-21 DIAGNOSIS — R110 Nausea: Secondary | ICD-10-CM | POA: Diagnosis not present

## 2024-05-21 DIAGNOSIS — R519 Headache, unspecified: Secondary | ICD-10-CM | POA: Diagnosis present

## 2024-05-21 DIAGNOSIS — R42 Dizziness and giddiness: Secondary | ICD-10-CM | POA: Insufficient documentation

## 2024-05-21 DIAGNOSIS — J449 Chronic obstructive pulmonary disease, unspecified: Secondary | ICD-10-CM | POA: Insufficient documentation

## 2024-05-21 DIAGNOSIS — H538 Other visual disturbances: Secondary | ICD-10-CM | POA: Insufficient documentation

## 2024-05-21 DIAGNOSIS — I2510 Atherosclerotic heart disease of native coronary artery without angina pectoris: Secondary | ICD-10-CM | POA: Diagnosis not present

## 2024-05-21 LAB — URINALYSIS, ROUTINE W REFLEX MICROSCOPIC
Bacteria, UA: NONE SEEN
Bilirubin Urine: NEGATIVE
Glucose, UA: NEGATIVE mg/dL
Hgb urine dipstick: NEGATIVE
Ketones, ur: 5 mg/dL — AB
Nitrite: NEGATIVE
Protein, ur: NEGATIVE mg/dL
Specific Gravity, Urine: 1.035 — ABNORMAL HIGH (ref 1.005–1.030)
pH: 5 (ref 5.0–8.0)

## 2024-05-21 LAB — COMPREHENSIVE METABOLIC PANEL WITH GFR
ALT: 26 U/L (ref 0–44)
AST: 19 U/L (ref 15–41)
Albumin: 4 g/dL (ref 3.5–5.0)
Alkaline Phosphatase: 117 U/L (ref 38–126)
Anion gap: 8 (ref 5–15)
BUN: 16 mg/dL (ref 6–20)
CO2: 24 mmol/L (ref 22–32)
Calcium: 9.3 mg/dL (ref 8.9–10.3)
Chloride: 111 mmol/L (ref 98–111)
Creatinine, Ser: 0.91 mg/dL (ref 0.44–1.00)
GFR, Estimated: >60 mL/min (ref >60)
Glucose, Bld: 123 mg/dL — ABNORMAL HIGH (ref 70–99)
Potassium: 3.8 mmol/L (ref 3.5–5.1)
Sodium: 143 mmol/L (ref 135–145)
Total Bilirubin: 0.5 mg/dL (ref 0.0–1.2)
Total Protein: 7.2 g/dL (ref 6.5–8.1)

## 2024-05-21 LAB — CBC
HCT: 34.9 % — ABNORMAL LOW (ref 36.0–46.0)
Hemoglobin: 11.8 g/dL — ABNORMAL LOW (ref 12.0–15.0)
MCH: 31.8 pg (ref 26.0–34.0)
MCHC: 33.8 g/dL (ref 30.0–36.0)
MCV: 94.1 fL (ref 80.0–100.0)
Platelets: 256 K/uL (ref 150–400)
RBC: 3.71 MIL/uL — ABNORMAL LOW (ref 3.87–5.11)
RDW: 12.8 % (ref 11.5–15.5)
WBC: 7.5 K/uL (ref 4.0–10.5)
nRBC: 0 % (ref 0.0–0.2)

## 2024-05-21 LAB — SEDIMENTATION RATE: Sed Rate: 26 mm/h (ref 0–30)

## 2024-05-21 MED ORDER — SODIUM CHLORIDE 0.9 % IV BOLUS
500.0000 mL | Freq: Once | INTRAVENOUS | Status: AC
  Administered 2024-05-21: 500 mL via INTRAVENOUS

## 2024-05-21 MED ORDER — METOCLOPRAMIDE HCL 10 MG PO TABS: 10.0000 mg | ORAL_TABLET | Freq: Three times a day (TID) | ORAL | 0 refills | Status: AC | PRN

## 2024-05-21 MED ORDER — DIPHENHYDRAMINE HCL 50 MG/ML IJ SOLN
25.0000 mg | Freq: Once | INTRAMUSCULAR | Status: AC
  Administered 2024-05-21: 25 mg via INTRAVENOUS
  Filled 2024-05-21: qty 1

## 2024-05-21 MED ORDER — METOCLOPRAMIDE HCL 5 MG/ML IJ SOLN
10.0000 mg | Freq: Once | INTRAMUSCULAR | Status: AC
  Administered 2024-05-21: 10 mg via INTRAVENOUS
  Filled 2024-05-21: qty 2

## 2024-05-21 MED ORDER — IOHEXOL 350 MG/ML SOLN
75.0000 mL | Freq: Once | INTRAVENOUS | Status: AC | PRN
  Administered 2024-05-21: 75 mL via INTRAVENOUS

## 2024-05-21 NOTE — ED Triage Notes (Signed)
 Pt to ED for c/o headache, nausea, and dizziness. States headache woke her up through the night. Pt states she has taken 4 Goody headache powders and 2400 mg Tylenol  today. Pt endorses blurred vision.

## 2024-05-21 NOTE — ED Provider Notes (Signed)
 [facility] Provider Note    Event Date/Time   First MD Initiated Contact with Patient 05/21/24 2036     (approximate)   History   Headache, Nausea, and Dizziness  Pt to ED for c/o headache, nausea, and dizziness. States headache woke her up through the night. Pt states she has taken 4 Goody headache powders and 2400 mg Tylenol  today. Pt endorses blurred vision.   HPI Heather Zamora is a 58 y.o. female PMH COPD, CAD, hyperlipidemia, carcinoid tumor of right lung presents for evaluation of headache - Patient is had severe left-sided headache since about 10:30 PM yesterday evening.  10/10, refractory to Tylenol  and Motrin .  Does note that it is somewhat intermittent, lasting a few minutes, going away for a few minutes, then recurring for few minutes.  Does endorse some nausea and dizziness.  Contrary to triage note, denies any vision change though notes when she looks up and down she feels more dizzy than usual. -No preceding trauma, not on blood thinners -Does endorse some left neck discomfort     Physical Exam   Triage Vital Signs: ED Triage Vitals  Encounter Vitals Group     BP 05/21/24 1822 137/78     Girls Systolic BP Percentile --      Girls Diastolic BP Percentile --      Boys Systolic BP Percentile --      Boys Diastolic BP Percentile --      Pulse Rate 05/21/24 1822 69     Resp 05/21/24 1822 18     Temp 05/21/24 1822 98.1 F (36.7 C)     Temp Source 05/21/24 1822 Oral     SpO2 05/21/24 1822 99 %     Weight 05/21/24 1823 182 lb (82.6 kg)     Height 05/21/24 1823 5' (1.524 m)     Head Circumference --      Peak Flow --      Pain Score 05/21/24 1827 10     Pain Loc --      Pain Education --      Exclude from Growth Chart --     Most recent vital signs: Vitals:   05/21/24 2104 05/21/24 2208  BP: 136/70   Pulse: 75   Resp: 17   Temp:  97.7 F (36.5 C)  SpO2: 100%      General: Awake, no distress.  HEENT: Normocephalic,  atraumatic.  PERRL.  No conjunctival injection or inappropriate pupillary dilatation.  No temporal tenderness, no skin hypersensitivity to touch. CV:  Good peripheral perfusion. RRR, RP 2+ Resp:  Normal effort. CTAB Abd:  No distention. Nontender to deep palpation throughout Neuro:  Face symmetric, moving all extremities spontaneously, no focal motor deficit appreciated   ED Results / Procedures / Treatments   Labs (all labs ordered are listed, but only abnormal results are displayed) Labs Reviewed  COMPREHENSIVE METABOLIC PANEL WITH GFR - Abnormal; Notable for the following components:      Result Value   Glucose, Bld 123 (*)    All other components within normal limits  CBC - Abnormal; Notable for the following components:   RBC 3.71 (*)    Hemoglobin 11.8 (*)    HCT 34.9 (*)    All other components within normal limits  URINALYSIS, ROUTINE W REFLEX MICROSCOPIC - Abnormal; Notable for the following components:   Color, Urine YELLOW (*)    APPearance CLEAR (*)    Specific Gravity, Urine 1.035 (*)  Ketones, ur 5 (*)    Leukocytes,Ua TRACE (*)    All other components within normal limits  SEDIMENTATION RATE     EKG  Ecg = sinus rhythm, rate 76, no ST elevation or depression, no significant repolarization abnormality, normal axis, normal intervals.  No evidence of ischemia nor arrhythmia on my interpretation.   RADIOLOGY Radiology interpreted by myself and radiology report reviewed.  No acute pathology identified.    PROCEDURES:  Critical Care performed: No  Procedures   MEDICATIONS ORDERED IN ED: Medications  metoCLOPramide  (REGLAN ) injection 10 mg (10 mg Intravenous Given 05/21/24 2211)  diphenhydrAMINE  (BENADRYL ) injection 25 mg (25 mg Intravenous Given 05/21/24 2211)  sodium chloride  0.9 % bolus 500 mL (500 mLs Intravenous New Bag/Given 05/21/24 2210)  iohexol  (OMNIPAQUE ) 350 MG/ML injection 75 mL (75 mLs Intravenous Contrast Given 05/21/24 2229)      IMPRESSION / MDM / ASSESSMENT AND PLAN / ED COURSE  I reviewed the triage vital signs and the nursing notes.                              DDX/MDM/AP: Differential diagnosis includes, but is not limited to, intracranial hemorrhage, aneurysm, carcinoid tumor metastases, carotid dissection.  Also considered benign headache types including migraine, tension, cluster.  Overall exam and history much less suggestive of temporal arteritis, very low clinical concern for this at this time, presentation also not consistent with trigeminal neuralgia.  Exam and history not consistent with glaucoma.  Plan: - Labs - CTA head and neck - Reglan , Benadryl , IV fluid - N.p.o. - Reassess  Patient's presentation is most consistent with acute presentation with potential threat to life or bodily function.  The patient is on the cardiac monitor to evaluate for evidence of arrhythmia and/or significant heart rate changes.  ED course below.  Workup unremarkable including CTA head and neck, ESR.  Given this, I do not believe we are missing any other dangerous cause of headache at this time.  Feeling better after Reglan , Benadryl .  Will prescribe Reglan  for use at home in addition to Tylenol  and Motrin  which she has already taken.  Plan for PMD follow-up.  ED return precautions in place.  Patient agrees with plan.  Clinical Course as of 05/21/24 2338  Mon May 21, 2024  2333 CTA head/neck: IMPRESSION: 1. No evidence of acute intracranial abnormality. MRI head with contrast could provide more sensitive evaluation for acute infarct and metastatic disease if clinically warranted. 2. No large vessel occlusion or proximal hemodynamically significant stenosis. 3. No evidence of dissection.   [MM]  2333 ESR wnl, not suggestive of temporal arteritis [MM]    Clinical Course User Index [MM] Clarine Ozell LABOR, MD     FINAL CLINICAL IMPRESSION(S) / ED DIAGNOSES   Final diagnoses:  Acute nonintractable  headache, unspecified headache type     Rx / DC Orders   ED Discharge Orders          Ordered    metoCLOPramide  (REGLAN ) 10 MG tablet  Every 8 hours PRN        05/21/24 2337             Note:  This document was prepared using Dragon voice recognition software and may include unintentional dictation errors.   Clarine Ozell LABOR, MD 05/21/24 [phone]

## 2024-05-21 NOTE — Discharge Instructions (Addendum)
 Your evaluation in the emergency department is overall reassuring.  I am unsure as to the exact cause of your headache, and we saw no concerning findings today.  You can continue to use Tylenol  and Motrin , and I also recommend drinking plenty of water.  I prescribed you a nausea medication that you can use for any nausea and recurrent headache as well.  Please do follow-up with your primary care provider for reevaluation, and return to the emergency department with any new or worsening symptoms.

## 2024-06-08 LAB — COLOGUARD: COLOGUARD: NEGATIVE
